# Patient Record
Sex: Female | Born: 1940 | Race: Black or African American | Hispanic: No | Smoking: Never smoker
Health system: Southern US, Community
[De-identification: ages and names within clinical notes are randomized; demographics above are authoritative.]

## PROBLEM LIST (undated history)

## (undated) DIAGNOSIS — E041 Nontoxic single thyroid nodule: Secondary | ICD-10-CM

## (undated) DIAGNOSIS — G609 Hereditary and idiopathic neuropathy, unspecified: Secondary | ICD-10-CM

## (undated) DIAGNOSIS — M1990 Unspecified osteoarthritis, unspecified site: Secondary | ICD-10-CM

## (undated) DIAGNOSIS — G5600 Carpal tunnel syndrome, unspecified upper limb: Secondary | ICD-10-CM

## (undated) DIAGNOSIS — E042 Nontoxic multinodular goiter: Secondary | ICD-10-CM

## (undated) DIAGNOSIS — F5104 Psychophysiologic insomnia: Secondary | ICD-10-CM

## (undated) DIAGNOSIS — E538 Deficiency of other specified B group vitamins: Secondary | ICD-10-CM

## (undated) DIAGNOSIS — M25559 Pain in unspecified hip: Secondary | ICD-10-CM

## (undated) DIAGNOSIS — E785 Hyperlipidemia, unspecified: Secondary | ICD-10-CM

## (undated) DIAGNOSIS — E669 Obesity, unspecified: Secondary | ICD-10-CM

## (undated) DIAGNOSIS — J309 Allergic rhinitis, unspecified: Secondary | ICD-10-CM

## (undated) DIAGNOSIS — I10 Essential (primary) hypertension: Secondary | ICD-10-CM

## (undated) DIAGNOSIS — Z972 Presence of dental prosthetic device (complete) (partial): Secondary | ICD-10-CM

## (undated) DIAGNOSIS — G8929 Other chronic pain: Secondary | ICD-10-CM

## (undated) DIAGNOSIS — M1711 Unilateral primary osteoarthritis, right knee: Secondary | ICD-10-CM

## (undated) HISTORY — PX: EYE SURGERY: SHX253

## (undated) HISTORY — DX: Hyperlipidemia, unspecified: E78.5

## (undated) HISTORY — DX: Obesity, unspecified: E66.9

## (undated) HISTORY — DX: Unspecified osteoarthritis, unspecified site: M19.90

## (undated) HISTORY — PX: BARIATRIC SURGERY: SHX1103

## (undated) HISTORY — DX: Essential (primary) hypertension: I10

## (undated) HISTORY — PX: COLONOSCOPY: SHX174

## (undated) HISTORY — PX: CHOLECYSTECTOMY: SHX55

---

## 2003-10-02 HISTORY — PX: JOINT REPLACEMENT: SHX530

## 2005-01-26 ENCOUNTER — Ambulatory Visit: Payer: Self-pay | Admitting: Family Medicine

## 2005-02-19 ENCOUNTER — Ambulatory Visit: Payer: Self-pay | Admitting: Family Medicine

## 2005-05-07 ENCOUNTER — Ambulatory Visit: Payer: Self-pay | Admitting: Family Medicine

## 2005-06-01 ENCOUNTER — Ambulatory Visit: Payer: Self-pay | Admitting: Family Medicine

## 2005-06-18 ENCOUNTER — Ambulatory Visit: Payer: Self-pay | Admitting: Family Medicine

## 2005-11-09 ENCOUNTER — Ambulatory Visit: Payer: Self-pay | Admitting: Family Medicine

## 2006-03-15 ENCOUNTER — Ambulatory Visit: Payer: Self-pay | Admitting: Family Medicine

## 2006-04-15 ENCOUNTER — Ambulatory Visit: Payer: Self-pay | Admitting: Family Medicine

## 2006-04-22 ENCOUNTER — Encounter: Payer: Self-pay | Admitting: Family Medicine

## 2006-04-22 ENCOUNTER — Other Ambulatory Visit: Admission: RE | Admit: 2006-04-22 | Discharge: 2006-04-22 | Payer: Self-pay | Admitting: Family Medicine

## 2006-04-22 ENCOUNTER — Ambulatory Visit: Payer: Self-pay | Admitting: Family Medicine

## 2006-06-11 ENCOUNTER — Ambulatory Visit: Payer: Self-pay | Admitting: Gastroenterology

## 2006-06-13 ENCOUNTER — Encounter: Admission: RE | Admit: 2006-06-13 | Discharge: 2006-06-13 | Payer: Self-pay | Admitting: Family Medicine

## 2006-06-13 ENCOUNTER — Ambulatory Visit: Payer: Self-pay | Admitting: Family Medicine

## 2006-06-17 ENCOUNTER — Encounter: Payer: Self-pay | Admitting: Family Medicine

## 2006-06-17 ENCOUNTER — Ambulatory Visit: Payer: Self-pay | Admitting: Gastroenterology

## 2006-10-01 HISTORY — PX: COLONOSCOPY: SHX174

## 2006-10-01 LAB — HM COLONOSCOPY

## 2007-01-01 ENCOUNTER — Ambulatory Visit: Payer: Self-pay | Admitting: Family Medicine

## 2007-01-01 LAB — CONVERTED CEMR LAB
Albumin: 3.8 g/dL (ref 3.5–5.2)
Alkaline Phosphatase: 75 units/L (ref 39–117)
BUN: 18 mg/dL (ref 6–23)
Creatinine, Ser: 1.1 mg/dL (ref 0.4–1.2)
GFR calc Af Amer: 65 mL/min
HDL: 46.7 mg/dL (ref >39.0)
Hgb A1c MFr Bld: 9.2 % — ABNORMAL HIGH (ref 4.6–6.0)
LDL Cholesterol: 68 mg/dL (ref 0–99)
Potassium: 4.9 meq/L (ref 3.5–5.1)
Sodium: 144 meq/L (ref 135–145)
Total Bilirubin: 0.6 mg/dL (ref 0.3–1.2)
Total CHOL/HDL Ratio: 3
Triglycerides: 136 mg/dL (ref 0–149)
VLDL: 27 mg/dL (ref 0–40)

## 2007-05-15 DIAGNOSIS — E785 Hyperlipidemia, unspecified: Secondary | ICD-10-CM

## 2007-05-15 DIAGNOSIS — I10 Essential (primary) hypertension: Secondary | ICD-10-CM | POA: Insufficient documentation

## 2007-05-21 ENCOUNTER — Ambulatory Visit: Payer: Self-pay | Admitting: Family Medicine

## 2007-05-21 ENCOUNTER — Encounter: Payer: Self-pay | Admitting: Family Medicine

## 2007-07-28 ENCOUNTER — Ambulatory Visit: Payer: Self-pay | Admitting: Family Medicine

## 2007-07-28 DIAGNOSIS — E119 Type 2 diabetes mellitus without complications: Secondary | ICD-10-CM

## 2007-08-01 ENCOUNTER — Ambulatory Visit: Payer: Self-pay | Admitting: Family Medicine

## 2007-08-06 LAB — CONVERTED CEMR LAB
Albumin: 3.8 g/dL (ref 3.5–5.2)
Alkaline Phosphatase: 85 units/L (ref 39–117)
BUN: 23 mg/dL (ref 6–23)
Basophils Absolute: 0 10*3/uL (ref 0.0–0.1)
Cholesterol: 123 mg/dL (ref 0–200)
Eosinophils Absolute: 0.1 10*3/uL (ref 0.0–0.6)
GFR calc Af Amer: 64 mL/min
GFR calc non Af Amer: 53 mL/min
HCT: 36.5 % (ref 36.0–46.0)
HDL: 39.8 mg/dL (ref >39.0)
Hemoglobin: 12.3 g/dL (ref 12.0–15.0)
Hgb A1c MFr Bld: 10 % — ABNORMAL HIGH (ref 4.6–6.0)
LDL Cholesterol: 67 mg/dL (ref 0–99)
MCHC: 33.9 g/dL (ref 30.0–36.0)
MCV: 85.8 fL (ref 78.0–100.0)
Monocytes Absolute: 0.4 10*3/uL (ref 0.2–0.7)
Monocytes Relative: 4.9 % (ref 3.0–11.0)
Neutrophils Relative %: 53.6 % (ref 43.0–77.0)
Potassium: 4.4 meq/L (ref 3.5–5.1)
Sodium: 144 meq/L (ref 135–145)
TSH: 2.45 microintl units/mL (ref 0.35–5.50)
Total CHOL/HDL Ratio: 3.1

## 2007-10-07 ENCOUNTER — Telehealth: Payer: Self-pay | Admitting: Family Medicine

## 2007-10-20 ENCOUNTER — Ambulatory Visit: Payer: Self-pay | Admitting: Family Medicine

## 2007-10-20 DIAGNOSIS — M1990 Unspecified osteoarthritis, unspecified site: Secondary | ICD-10-CM | POA: Insufficient documentation

## 2007-10-21 ENCOUNTER — Telehealth: Payer: Self-pay | Admitting: Family Medicine

## 2007-10-23 ENCOUNTER — Telehealth: Payer: Self-pay | Admitting: Family Medicine

## 2007-10-24 ENCOUNTER — Telehealth: Payer: Self-pay | Admitting: Family Medicine

## 2007-11-05 ENCOUNTER — Telehealth: Payer: Self-pay | Admitting: Family Medicine

## 2007-12-03 ENCOUNTER — Ambulatory Visit: Payer: Self-pay | Admitting: Family Medicine

## 2007-12-03 LAB — CONVERTED CEMR LAB: Blood Glucose, Fingerstick: 124

## 2008-01-12 ENCOUNTER — Telehealth: Payer: Self-pay | Admitting: Internal Medicine

## 2008-01-14 ENCOUNTER — Ambulatory Visit: Payer: Self-pay | Admitting: Internal Medicine

## 2008-01-14 DIAGNOSIS — B3731 Acute candidiasis of vulva and vagina: Secondary | ICD-10-CM | POA: Insufficient documentation

## 2008-01-14 DIAGNOSIS — B373 Candidiasis of vulva and vagina: Secondary | ICD-10-CM

## 2008-01-14 LAB — CONVERTED CEMR LAB
Blood in Urine, dipstick: NEGATIVE
Glucose, Urine, Semiquant: NEGATIVE
Ketones, urine, test strip: NEGATIVE
Nitrite: NEGATIVE
Protein, U semiquant: NEGATIVE
Specific Gravity, Urine: 1.02
pH: 5.5

## 2008-05-05 ENCOUNTER — Telehealth: Payer: Self-pay | Admitting: Family Medicine

## 2008-05-12 ENCOUNTER — Encounter: Payer: Self-pay | Admitting: Family Medicine

## 2008-05-12 ENCOUNTER — Ambulatory Visit: Payer: Self-pay | Admitting: Family Medicine

## 2008-05-12 ENCOUNTER — Other Ambulatory Visit: Admission: RE | Admit: 2008-05-12 | Discharge: 2008-05-12 | Payer: Self-pay | Admitting: Family Medicine

## 2008-05-13 ENCOUNTER — Encounter: Payer: Self-pay | Admitting: Family Medicine

## 2008-05-26 ENCOUNTER — Encounter: Payer: Self-pay | Admitting: Family Medicine

## 2008-05-26 ENCOUNTER — Encounter: Admission: RE | Admit: 2008-05-26 | Discharge: 2008-05-26 | Payer: Self-pay | Admitting: Family Medicine

## 2008-05-28 ENCOUNTER — Ambulatory Visit: Payer: Self-pay | Admitting: Family Medicine

## 2008-05-31 ENCOUNTER — Encounter: Payer: Self-pay | Admitting: Family Medicine

## 2008-06-09 ENCOUNTER — Telehealth: Payer: Self-pay | Admitting: Family Medicine

## 2008-06-29 ENCOUNTER — Ambulatory Visit: Payer: Self-pay | Admitting: Family Medicine

## 2008-06-30 ENCOUNTER — Encounter: Payer: Self-pay | Admitting: Family Medicine

## 2008-06-30 ENCOUNTER — Ambulatory Visit: Payer: Self-pay | Admitting: Family Medicine

## 2008-08-18 ENCOUNTER — Encounter: Payer: Self-pay | Admitting: Family Medicine

## 2008-08-25 ENCOUNTER — Telehealth: Payer: Self-pay | Admitting: *Deleted

## 2008-08-30 ENCOUNTER — Telehealth: Payer: Self-pay | Admitting: Family Medicine

## 2008-09-09 ENCOUNTER — Ambulatory Visit: Payer: Self-pay | Admitting: Family Medicine

## 2008-09-10 LAB — CONVERTED CEMR LAB
Albumin: 3.7 g/dL (ref 3.5–5.2)
Alkaline Phosphatase: 79 units/L (ref 39–117)
Basophils Absolute: 0 10*3/uL (ref 0.0–0.1)
Bilirubin, Direct: 0.1 mg/dL (ref 0.0–0.3)
Eosinophils Absolute: 0.3 10*3/uL (ref 0.0–0.7)
GFR calc Af Amer: 72 mL/min
GFR calc non Af Amer: 59 mL/min
HCT: 34.4 % — ABNORMAL LOW (ref 36.0–46.0)
HDL: 42.6 mg/dL (ref >39.0)
MCV: 87.7 fL (ref 78.0–100.0)
Monocytes Absolute: 0.4 10*3/uL (ref 0.1–1.0)
Platelets: 132 10*3/uL — ABNORMAL LOW (ref 150–400)
Potassium: 4.2 meq/L (ref 3.5–5.1)
RDW: 13.8 % (ref 11.5–14.6)
Sodium: 144 meq/L (ref 135–145)
Triglycerides: 53 mg/dL (ref 0–149)

## 2008-10-12 ENCOUNTER — Telehealth: Payer: Self-pay | Admitting: Family Medicine

## 2008-11-22 ENCOUNTER — Ambulatory Visit: Payer: Self-pay | Admitting: Family Medicine

## 2008-11-26 ENCOUNTER — Encounter: Payer: Self-pay | Admitting: Family Medicine

## 2008-12-20 ENCOUNTER — Ambulatory Visit: Payer: Self-pay | Admitting: Family Medicine

## 2009-01-24 ENCOUNTER — Ambulatory Visit: Payer: Self-pay | Admitting: Family Medicine

## 2009-02-22 ENCOUNTER — Ambulatory Visit: Payer: Self-pay | Admitting: Family Medicine

## 2009-03-28 ENCOUNTER — Ambulatory Visit: Payer: Self-pay | Admitting: Family Medicine

## 2009-04-25 ENCOUNTER — Ambulatory Visit: Payer: Self-pay | Admitting: Family Medicine

## 2009-05-17 ENCOUNTER — Telehealth: Payer: Self-pay | Admitting: Family Medicine

## 2009-06-02 ENCOUNTER — Ambulatory Visit: Payer: Self-pay | Admitting: Family Medicine

## 2009-06-22 ENCOUNTER — Telehealth: Payer: Self-pay | Admitting: Family Medicine

## 2009-07-28 ENCOUNTER — Encounter (INDEPENDENT_AMBULATORY_CARE_PROVIDER_SITE_OTHER): Payer: Self-pay | Admitting: *Deleted

## 2009-08-19 ENCOUNTER — Ambulatory Visit: Payer: Self-pay | Admitting: Family Medicine

## 2009-08-19 LAB — CONVERTED CEMR LAB: Blood Glucose, AC Bkfst: 88 mg/dL

## 2009-08-29 ENCOUNTER — Ambulatory Visit: Payer: Self-pay | Admitting: Family Medicine

## 2009-08-29 ENCOUNTER — Encounter (INDEPENDENT_AMBULATORY_CARE_PROVIDER_SITE_OTHER): Payer: Self-pay | Admitting: *Deleted

## 2009-08-29 ENCOUNTER — Encounter: Payer: Self-pay | Admitting: Family Medicine

## 2009-09-02 ENCOUNTER — Telehealth: Payer: Self-pay | Admitting: Family Medicine

## 2009-11-30 ENCOUNTER — Ambulatory Visit: Payer: Self-pay | Admitting: Family Medicine

## 2009-11-30 DIAGNOSIS — G609 Hereditary and idiopathic neuropathy, unspecified: Secondary | ICD-10-CM | POA: Insufficient documentation

## 2009-12-01 LAB — CONVERTED CEMR LAB
Alkaline Phosphatase: 86 units/L (ref 39–117)
BUN: 6 mg/dL (ref 6–23)
Basophils Relative: 0.5 % (ref 0.0–3.0)
Bilirubin, Direct: 0.1 mg/dL (ref 0.0–0.3)
Calcium: 9.5 mg/dL (ref 8.4–10.5)
Creatinine, Ser: 0.8 mg/dL (ref 0.4–1.2)
Creatinine,U: 246.4 mg/dL
Eosinophils Absolute: 0.2 10*3/uL (ref 0.0–0.7)
Eosinophils Relative: 2.6 % (ref 0.0–5.0)
Hgb A1c MFr Bld: 5.9 % (ref 4.6–6.5)
Lymphocytes Relative: 39.2 % (ref 12.0–46.0)
Neutrophils Relative %: 51.6 % (ref 43.0–77.0)
Platelets: 162 10*3/uL (ref 150.0–400.0)
RBC: 3.88 M/uL (ref 3.87–5.11)
Total Bilirubin: 0.3 mg/dL (ref 0.3–1.2)
Total Protein: 6.3 g/dL (ref 6.0–8.3)
Vitamin B-12: 496 pg/mL (ref 211–911)
WBC: 6.2 10*3/uL (ref 4.5–10.5)

## 2010-02-02 ENCOUNTER — Ambulatory Visit: Payer: Self-pay | Admitting: Family Medicine

## 2010-02-02 LAB — CONVERTED CEMR LAB: Blood Glucose, Fingerstick: 106

## 2010-03-29 ENCOUNTER — Encounter: Payer: Self-pay | Admitting: Family Medicine

## 2010-05-23 ENCOUNTER — Ambulatory Visit: Payer: Self-pay | Admitting: Family Medicine

## 2010-07-04 ENCOUNTER — Telehealth: Payer: Self-pay | Admitting: Family Medicine

## 2010-07-17 ENCOUNTER — Ambulatory Visit: Payer: Self-pay | Admitting: Family Medicine

## 2010-07-17 DIAGNOSIS — L259 Unspecified contact dermatitis, unspecified cause: Secondary | ICD-10-CM

## 2010-07-17 DIAGNOSIS — R300 Dysuria: Secondary | ICD-10-CM | POA: Insufficient documentation

## 2010-07-17 LAB — CONVERTED CEMR LAB
Bilirubin Urine: NEGATIVE
Nitrite: NEGATIVE
Protein, U semiquant: NEGATIVE
Urobilinogen, UA: 0.2
WBC Urine, dipstick: NEGATIVE

## 2010-08-30 ENCOUNTER — Ambulatory Visit: Payer: Self-pay | Admitting: Family Medicine

## 2010-08-30 ENCOUNTER — Encounter: Payer: Self-pay | Admitting: Family Medicine

## 2010-10-22 ENCOUNTER — Encounter: Payer: Self-pay | Admitting: Surgery

## 2010-10-28 ENCOUNTER — Encounter: Payer: Self-pay | Admitting: Family Medicine

## 2010-10-29 LAB — CONVERTED CEMR LAB
ALT: 13 units/L (ref 0–35)
ALT: 15 units/L (ref 0–35)
AST: 16 units/L (ref 0–37)
AST: 17 units/L (ref 0–37)
Albumin: 3.7 g/dL (ref 3.5–5.2)
Alkaline Phosphatase: 85 units/L (ref 39–117)
Alkaline Phosphatase: 89 units/L (ref 39–117)
BUN: 13 mg/dL (ref 6–23)
BUN: 16 mg/dL (ref 6–23)
Basophils Absolute: 0 10*3/uL (ref 0.0–0.1)
Basophils Relative: 0.3 % (ref 0.0–3.0)
Basophils Relative: 0.4 % (ref 0.0–1.0)
Bilirubin, Direct: 0.1 mg/dL (ref 0.0–0.3)
CO2: 29 meq/L (ref 19–32)
CO2: 30 meq/L (ref 19–32)
Calcium: 9.8 mg/dL (ref 8.4–10.5)
Calcium: 9.8 mg/dL (ref 8.4–10.5)
Chloride: 100 meq/L (ref 96–112)
Chloride: 111 meq/L (ref 96–112)
Cholesterol: 132 mg/dL (ref 0–200)
Creatinine, Ser: 1 mg/dL (ref 0.4–1.2)
Creatinine,U: 193.1 mg/dL
Eosinophils Absolute: 0.2 10*3/uL (ref 0.0–0.7)
Eosinophils Relative: 2.2 % (ref 0.0–5.0)
Eosinophils Relative: 2.4 % (ref 0.0–5.0)
GFR calc Af Amer: 72 mL/min
GFR calc non Af Amer: 71.33 mL/min (ref >60)
Glucose, Bld: 87 mg/dL (ref 70–99)
HCT: 35.8 % — ABNORMAL LOW (ref 36.0–46.0)
HDL: 51 mg/dL (ref >39.00)
HDL: 55.2 mg/dL (ref >39.0)
Hemoglobin: 12.1 g/dL (ref 12.0–15.0)
Hgb A1c MFr Bld: 8.5 % — ABNORMAL HIGH (ref 4.6–6.5)
Ketones, urine, test strip: NEGATIVE
LDL Cholesterol: 69 mg/dL (ref 0–99)
LDL Cholesterol: 99 mg/dL (ref 0–99)
Lymphocytes Relative: 36 % (ref 12.0–46.0)
Lymphocytes Relative: 38.3 % (ref 12.0–46.0)
Lymphs Abs: 2.4 10*3/uL (ref 0.7–4.0)
MCHC: 33.8 g/dL (ref 30.0–36.0)
MCV: 85.9 fL (ref 78.0–100.0)
Microalb Creat Ratio: 4.1 mg/g (ref 0.0–30.0)
Microalb, Ur: 0.8 mg/dL (ref 0.0–1.9)
Monocytes Absolute: 0.4 10*3/uL (ref 0.1–1.0)
Monocytes Relative: 5.8 % (ref 3.0–11.0)
Monocytes Relative: 6.4 % (ref 3.0–12.0)
Neutro Abs: 3.7 10*3/uL (ref 1.4–7.7)
Neutro Abs: 3.8 10*3/uL (ref 1.4–7.7)
Neutrophils Relative %: 54.9 % (ref 43.0–77.0)
Nitrite: NEGATIVE
Platelets: 126 10*3/uL — ABNORMAL LOW (ref 150.0–400.0)
Platelets: 154 10*3/uL (ref 150–400)
Potassium: 3.8 meq/L (ref 3.5–5.1)
RBC: 4.05 M/uL (ref 3.87–5.11)
RBC: 4.17 M/uL (ref 3.87–5.11)
RDW: 13.7 % (ref 11.5–14.6)
RDW: 13.8 % (ref 11.5–14.6)
Sodium: 144 meq/L (ref 135–145)
Specific Gravity, Urine: >=1.03
TSH: 1.74 microintl units/mL (ref 0.35–5.50)
Total Bilirubin: 0.5 mg/dL (ref 0.3–1.2)
Total CHOL/HDL Ratio: 3
Total CHOL/HDL Ratio: 3.1
Total Protein: 6.6 g/dL (ref 6.0–8.3)
Total Protein: 7.2 g/dL (ref 6.0–8.3)
Triglycerides: 61 mg/dL (ref 0.0–149.0)
Triglycerides: 83 mg/dL (ref 0–149)
Urobilinogen, UA: 0.2
VLDL: 12.2 mg/dL (ref 0.0–40.0)
VLDL: 17 mg/dL (ref 0–40)
WBC Urine, dipstick: NEGATIVE
WBC: 6.8 10*3/uL (ref 4.5–10.5)
WBC: 6.8 10*3/uL (ref 4.5–10.5)

## 2010-10-31 NOTE — Assessment & Plan Note (Signed)
Summary: emp/pt fasting/cjr   Vital Signs:  Patient profile:   70 year old female Weight:      193 pounds BMI:     37.83 BP sitting:   114 / 70  (left arm) Cuff size:   regular  Vitals Entered By: Raechel Ache, RN (May 23, 2010 8:57 AM) CC: OV, fasting. 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 BS's 140-150's.   History of Present Illness: 70 yr old female for a cpx. She is doing well and feels fine. She has recovered well from her gastric bypass surgery, and she continues to slowly lose weight. She had comnplete labs drawn on 03-29-10 through her surgeon's office, including an A1c which was 7.7.  Her meds have been decreased a bit to keep from dropping her BP too low.   Allergies (verified): No Known Drug Allergies  Past History:  Past Medical History: Reviewed history from 11/22/2008 and no changes required. Hyperlipidemia Hypertension Osteoarthritis Diabetes mellitus, type II obesity  Past Surgical History: Reviewed history from 11/30/2009 and no changes required. Cholecystectomy Total knee replacement, left 2005 colonoscopy 2008, per Dr. Jarold Motto, diverticulosis  only, repeat 5 yrs bariatric surgery 10-27-09 per Dr. Annabelle Harman Portenier at [facility] History: Reviewed history from 05/15/2007 and no changes required. Family History of Arthritis Family History Diabetes 1st degree relative Family History Hypertension Family History of Cardiovascular disorder  Social History: Reviewed history from 05/15/2007 and no changes required. Never Smoked Alcohol use-no Drug use-no Regular exercise-yes  Review of Systems  The patient denies anorexia, fever, weight gain, vision loss, decreased hearing, hoarseness, chest pain, syncope, dyspnea on exertion, peripheral edema, prolonged cough, headaches, hemoptysis, abdominal pain, melena, hematochezia, severe indigestion/heartburn, hematuria, incontinence, genital sores, muscle weakness, suspicious skin lesions, transient blindness,  difficulty walking, depression, unusual weight change, abnormal bleeding, enlarged lymph nodes, angioedema, breast masses, and testicular masses.    Physical Exam  General:  overweight-appearing.   Head:  Normocephalic and atraumatic without obvious abnormalities. No apparent alopecia or balding. Eyes:  No corneal or conjunctival inflammation noted. EOMI. Perrla. Funduscopic exam benign, without hemorrhages, exudates or papilledema. Vision grossly normal. Ears:  External ear exam shows no significant lesions or deformities.  Otoscopic examination reveals clear canals, tympanic membranes are intact bilaterally without bulging, retraction, inflammation or discharge. Hearing is grossly normal bilaterally. Nose:  External nasal examination shows no deformity or inflammation. Nasal mucosa are pink and moist without lesions or exudates. Mouth:  Oral mucosa and oropharynx without lesions or exudates.  Teeth in good repair. Neck:  No deformities, masses, or tenderness noted. Chest Wall:  No deformities, masses, or tenderness noted. Breasts:  No mass, nodules, thickening, tenderness, bulging, retraction, inflamation, nipple discharge or skin changes noted.   Lungs:  Normal respiratory effort, chest expands symmetrically. Lungs are clear to auscultation, no crackles or wheezes. Heart:  Normal rate and regular rhythm. S1 and S2 normal without gallop, murmur, click, rub or other extra sounds. EKG normal Abdomen:  Bowel sounds positive,abdomen soft and non-tender without masses, organomegaly or hernias noted. Msk:  No deformity or scoliosis noted of thoracic or lumbar spine.   Pulses:  R and L carotid,radial,femoral,dorsalis pedis and posterior tibial pulses are full and equal bilaterally Extremities:  No clubbing, cyanosis, edema, or deformity noted with normal full range of motion of all joints.   Neurologic:  No cranial nerve deficits noted. Station and gait are normal. Plantar reflexes are down-going  bilaterally. DTRs are symmetrical throughout. Sensory, motor and coordinative functions appear intact. Skin:  Intact without suspicious lesions or rashes Cervical Nodes:  No lymphadenopathy noted Axillary Nodes:  No palpable lymphadenopathy Inguinal Nodes:  No significant adenopathy Psych:  Cognition and judgment appear intact. Alert and cooperative with normal attention span and concentration. No apparent delusions, illusions, hallucinations   Impression & Recommendations:  Problem # 1:  WELL ADULT EXAM (ICD-V70.0)  Orders: EKG w/ Interpretation (93000)  Complete Medication List: 1)  Lantus 100 Unit/ml Soln (Insulin glargine) .Marland Kitchen.. 15  units at bedtime 2)  Zocor 40 Mg Tabs (Simvastatin) .... Take 1 tablet by mouth once a day 3)  Tylenol Extra Strength 500 Mg Tabs (Acetaminophen) .... 2 tabs two times a day 4)  Furosemide 40 Mg Tabs (Furosemide) .... 1/2 tab once daily 5)  Metformin Hcl 500 Mg Tabs (Metformin hcl) .... 2 tabs two times a day 6)  Aspirin 81 Mg Tbec (Aspirin) .... Once daily 7)  Enalapril Maleate 5 Mg Tabs (Enalapril maleate) .... Once daily 8)  Omeprazole 20 Mg Cpdr (Omeprazole) .... Once daily  Patient Instructions: 1)  Will make some medication adjustments as above.  2)  Please schedule a follow-up appointment in 6 months .  3)  It is important that you exercise reguarly at least 20 minutes 5 times a week. If you develop chest pain, have severe difficulty breathing, or feel very tired, stop exercising immediately and seek medical attention.  Prescriptions: METFORMIN HCL 500 MG TABS (METFORMIN HCL) 2 tabs two times a day  #120 x 11   Entered and Authorized by:   Nelwyn Salisbury MD   Signed by:   Nelwyn Salisbury MD on 05/23/2010   Method used:   Electronically to        [facility]. # [phone]* (retail)       [address]       [place], [place]  [address]       Ph: [phone]       Fax: [phone]   RxID:   [id number] METFORMIN HCL 500 MG  TABS (METFORMIN HCL) two times a day  #30 x 11   Entered and Authorized by:   Nelwyn Salisbury MD   Signed by:   Nelwyn Salisbury MD on 05/23/2010   Method used:   Electronically to        [facility]. # [phone]* (retail)       [address]       [place], [place]  [address]       Ph: [phone]       Fax: [phone]   RxID:   [id number] ZOCOR 40 MG  TABS (SIMVASTATIN) Take 1 tablet by mouth once a day  #30 x 11   Entered and Authorized by:   Nelwyn Salisbury MD   Signed by:   Nelwyn Salisbury MD on 05/23/2010   Method used:   Electronically to        [facility]. # [id number]* (retail)       [address]       [place], [place]  [address]       Ph: [phone]       Fax: [phone]   RxID:   [id number] ENALAPRIL MALEATE 5 MG TABS (ENALAPRIL MALEATE) once daily  #30 x 11   Entered and Authorized by:   Jeannett Senior  Marguerita Beards MD   Signed by:   Nelwyn Salisbury MD on 05/23/2010   Method used:   Electronically to        [facility]. # [phone]* (retail)       [address]       [place], [place]  [address]       Ph: [phone]       Fax: [phone]   RxID:   [id number]   Laboratory Results   Urine Tests    Routine Urinalysis   Color: yellow Appearance: Clear Glucose: negative   (Normal Range: Negative) Bilirubin: negative   (Normal Range: Negative) Ketone: negative   (Normal Range: Negative) Spec. Gravity: >=1.030   (Normal Range: 1.003-1.035) Blood: negative   (Normal Range: Negative) pH: 6.0   (Normal Range: 5.0-8.0) Protein: trace   (Normal Range: Negative) Urobilinogen: 0.2   (Normal Range: 0-1) Nitrite: negative   (Normal Range: Negative) Leukocyte Esterace: negative   (Normal Range: Negative)

## 2010-10-31 NOTE — Progress Notes (Signed)
Summary: REQUEST FOR ABX  Phone Note Call from Patient   Caller: Patient    [phone] Summary of Call: Pt adv that she wants to know if she can have a Rx for abx sent to  [facility] - [place], [place].... Pt c/o  itching and painful urination, burning to area.... Pt was offered OV but declined stating she was just in office last month.  Initial call taken by: Debbra Riding,  July 04, 2010 4:27 PM  Follow-up for Phone Call        yes but she wasn't sick last month. She needs an OV  Follow-up by: Nelwyn Salisbury MD,  July 05, 2010 9:51 AM  Additional Follow-up for Phone Call Additional follow up Details #1::        Left msg for pt to c/b if she would like to schedule appt.  Additional Follow-up by: Debbra Riding,  July 05, 2010 9:56 AM

## 2010-10-31 NOTE — Assessment & Plan Note (Signed)
Summary: consult re: itching around ankles and back for approx 3 weeks...   Vital Signs:  Patient profile:   70 year old female Weight:      191 pounds O2 Sat:      98 % Temp:     98.6 degrees F Pulse rate:   71 / minute BP sitting:   124 / 82  (left arm)  Vitals Entered By: Pura Spice, RN (July 17, 2010 2:47 PM) CC: rash ankles and back area c/o itching  Comments Flu Vaccine Consent Questions     Do you have a history of severe allergic reactions to this vaccine? no    Any prior history of allergic reactions to egg and/or gelatin? no    Do you have a sensitivity to the preservative Thimersol? no    Do you have a past history of Guillan-Barre Syndrome? no    Do you currently have an acute febrile illness? no    Have you ever had a severe reaction to latex? no    Vaccine information given and explained to patient? yes    Are you currently pregnant? no    Lot Number:[id number]   Exp Date:03/31/2011   Site Given  Left Deltoid IM Pura Spice, RN  July 17, 2010 2:48 PM    History of Present Illness: Here for a 3 weeks hx of an intensely itchy rash over the back and on both lower legs. Using OTC moisterizers. Also she mentions that she had a few days of burning on urination and cloudy urine about 2 weeks ago. No fever or nausea. Then this all went away.   Allergies (verified): No Known Drug Allergies  Past History:  Past Medical History: Reviewed history from 11/22/2008 and no changes required. Hyperlipidemia Hypertension Osteoarthritis Diabetes mellitus, type II obesity  Past Surgical History: Reviewed history from 11/30/2009 and no changes required. Cholecystectomy Total knee replacement, left 2005 colonoscopy 2008, per Dr. Jarold Motto, diverticulosis  only, repeat 5 yrs bariatric surgery 10-27-09 per Dr. Annabelle Harman Portenier at [facility]  Review of Systems  The patient denies anorexia, fever, weight loss, weight gain, vision loss, decreased hearing, hoarseness, chest  pain, syncope, dyspnea on exertion, peripheral edema, prolonged cough, headaches, hemoptysis, abdominal pain, melena, hematochezia, severe indigestion/heartburn, hematuria, incontinence, genital sores, muscle weakness, suspicious skin lesions, transient blindness, difficulty walking, depression, unusual weight change, abnormal bleeding, enlarged lymph nodes, angioedema, breast masses, and testicular masses.    Physical Exam  General:  Well-developed,well-nourished,in no acute distress; alert,appropriate and cooperative throughout examination Lungs:  Normal respiratory effort, chest expands symmetrically. Lungs are clear to auscultation, no crackles or wheezes. Heart:  Normal rate and regular rhythm. S1 and S2 normal without gallop, murmur, click, rub or other extra sounds. Abdomen:  Bowel sounds positive,abdomen soft and non-tender without masses, organomegaly or hernias noted. Skin:  patches of red macular scaly skin as above    Impression & Recommendations:  Problem # 1:  ECZEMA (ICD-692.9)  Her updated medication list for this problem includes:    Triamcinolone Acetonide 0.5 % Crea (Triamcinolone acetonide) .Marland Kitchen... Apply two times a day as needed  Problem # 2:  DYSURIA (ICD-788.1)  Orders: UA Dipstick w/o Micro (manual) (16109)  Complete Medication List: 1)  Lantus 100 Unit/ml Soln (Insulin glargine) .Marland Kitchen.. 15  units at bedtime 2)  Zocor 40 Mg Tabs (Simvastatin) .... Take 1 tablet by mouth once a day 3)  Tylenol Extra Strength 500 Mg Tabs (Acetaminophen) .... 2 tabs two times a day 4)  Furosemide 40 Mg Tabs (Furosemide) .... 1/2 tab once daily 5)  Metformin Hcl 500 Mg Tabs (Metformin hcl) .... 2 tabs two times a day 6)  Aspirin 81 Mg Tbec (Aspirin) .... Once daily 7)  Enalapril Maleate 5 Mg Tabs (Enalapril maleate) .... Once daily 8)  Omeprazole 20 Mg Cpdr (Omeprazole) .... Once daily 9)  Triamcinolone Acetonide 0.5 % Crea (Triamcinolone acetonide) .... Apply two times a day as  needed  Other Orders: Flu Vaccine 69yrs + MEDICARE PATIENTS (U7253) Administration Flu vaccine - MCR (G6440)  Patient Instructions: 1)  Please schedule a follow-up appointment as needed .  Prescriptions: TRIAMCINOLONE ACETONIDE 0.5 % CREA (TRIAMCINOLONE ACETONIDE) apply two times a day as needed  #60 x 5   Entered and Authorized by:   Nelwyn Salisbury MD   Signed by:   Nelwyn Salisbury MD on 07/17/2010   Method used:   Electronically to        [facility]. # [phone]* (retail)       [address]       [place], [place]  [address]       Ph: [phone]       Fax: [phone]   RxID:   [id number]    Orders Added: 1)  Flu Vaccine 8yrs + MEDICARE PATIENTS [Q2039] 2)  Administration Flu vaccine - MCR [G0008] 3)  Est. Patient Level IV [02542] 4)  UA Dipstick w/o Micro (manual) [81002]    Laboratory Results   Urine Tests  Date/Time Received: July 17, 2010 3:02 PM  Date/Time Reported: 3:02 PM   Routine Urinalysis   Color: yellow Appearance: Clear Glucose: negative   (Normal Range: Negative) Bilirubin: negative   (Normal Range: Negative) Ketone: negative   (Normal Range: Negative) Spec. Gravity: 1.015   (Normal Range: 1.003-1.035) Blood: negative   (Normal Range: Negative) pH: 5.0   (Normal Range: 5.0-8.0) Protein: negative   (Normal Range: Negative) Urobilinogen: 0.2   (Normal Range: 0-1) Nitrite: negative   (Normal Range: Negative) Leukocyte Esterace: negative   (Normal Range: Negative)    Comments: Pura Spice, RN  July 17, 2010 3:03 PM

## 2010-10-31 NOTE — Assessment & Plan Note (Signed)
Summary: fu on dizziness/njr   Vital Signs:  Patient profile:   70 year old female Weight:      208 pounds BMI:     40.77 Temp:     98.3 degrees F oral BP sitting:   124 / 78  (left arm) Cuff size:   regular  Vitals Entered By: Raechel Ache, RN (Feb 02, 2010 10:42 AM) CC: Still feels dizzy, headache and feels drunk. Is Patient Diabetic? Yes CBG Result 106   History of Present Illness: Here to follow up on HTN and DM. She has been very vigilant about the food she eats, and she has been able to lose 22 lbs in the past 2 months. Now she feels better with more energy most of the time, but she does have spells when she gets a little weak and a little lightheaded.   Allergies: No Known Drug Allergies  Past History:  Past Medical History: Reviewed history from 11/22/2008 and no changes required. Hyperlipidemia Hypertension Osteoarthritis Diabetes mellitus, type II obesity  Past Surgical History: Reviewed history from 11/30/2009 and no changes required. Cholecystectomy Total knee replacement, left 2005 colonoscopy 2008, per Dr. Jarold Motto, diverticulosis  only, repeat 5 yrs bariatric surgery 10-27-09 per Dr. Annabelle Harman Portenier at [facility]  Review of Systems  The patient denies anorexia, fever, weight gain, vision loss, decreased hearing, hoarseness, chest pain, syncope, dyspnea on exertion, peripheral edema, prolonged cough, headaches, hemoptysis, abdominal pain, melena, hematochezia, severe indigestion/heartburn, hematuria, incontinence, genital sores, muscle weakness, suspicious skin lesions, transient blindness, difficulty walking, depression, unusual weight change, abnormal bleeding, enlarged lymph nodes, angioedema, breast masses, and testicular masses.    Physical Exam  General:  Well-developed,well-nourished,in no acute distress; alert,appropriate and cooperative throughout examination Neck:  No deformities, masses, or tenderness noted. Lungs:  Normal respiratory effort, chest  expands symmetrically. Lungs are clear to auscultation, no crackles or wheezes. Heart:  Normal rate and regular rhythm. S1 and S2 normal without gallop, murmur, click, rub or other extra sounds. Neurologic:  alert & oriented X3, cranial nerves II-XII intact, and gait normal.     Impression & Recommendations:  Problem # 1:  DIABETES MELLITUS, TYPE II (ICD-250.00)  The following medications were removed from the medication list:    Metformin Hcl 500 Mg Tabs (Metformin hcl) .Marland Kitchen... Take two tablets twice daily Her updated medication list for this problem includes:    Enalapril Maleate 20 Mg Tabs (Enalapril maleate) .Marland Kitchen... 1/2 tab once daily    Aspirin 325 Mg Tabs (Aspirin) .Marland Kitchen... Take 1 tablet by mouth once a day    Lantus 100 Unit/ml Soln (Insulin glargine) .Marland KitchenMarland KitchenMarland KitchenMarland Kitchen 15  units at bedtime    Metformin Hcl 500 Mg Tabs (Metformin hcl) .Marland Kitchen... 1/2 tab two times a day  Orders: Capillary Blood Glucose/CBG (62703)  Problem # 2:  HYPERTENSION (ICD-401.9)  Her updated medication list for this problem includes:    Enalapril Maleate 20 Mg Tabs (Enalapril maleate) .Marland Kitchen... 1/2 tab once daily    Furosemide 40 Mg Tabs (Furosemide) ..... Once daily as needed fluid  Complete Medication List: 1)  Enalapril Maleate 20 Mg Tabs (Enalapril maleate) .... 1/2 tab once daily 2)  Aspirin 325 Mg Tabs (Aspirin) .... Take 1 tablet by mouth once a day 3)  Lantus 100 Unit/ml Soln (Insulin glargine) .Marland Kitchen.. 15  units at bedtime 4)  Zocor 40 Mg Tabs (Simvastatin) .... Take 1 tablet by mouth once a day 5)  Tylenol Extra Strength 500 Mg Tabs (Acetaminophen) .... 2 tabs two times a  day 6)  Temazepam 30 Mg Caps (Temazepam) .Marland Kitchen.. 1 at bedtime 7)  Furosemide 40 Mg Tabs (Furosemide) .... Once daily as needed fluid 8)  Metformin Hcl 500 Mg Tabs (Metformin hcl) .... 1/2 tab two times a day  Patient Instructions: 1)  Change meds as we discussed.  2)  It is important that you exercise reguarly at least 20 minutes 5 times a week. If you  develop chest pain, have severe difficulty breathing, or feel very tired, stop exercising immediately and seek medical attention.  3)  Please schedule a follow-up appointment in 3 months .  Prescriptions: TEMAZEPAM 30 MG CAPS (TEMAZEPAM) 1 at bedtime  #30 x 5   Entered and Authorized by:   Nelwyn Salisbury MD   Signed by:   Nelwyn Salisbury MD on 02/02/2010   Method used:   Print then Give to Patient   RxID:   [id number] ENALAPRIL MALEATE 20 MG  TABS (ENALAPRIL MALEATE) 1/2 tab once daily  #30 x 11   Entered and Authorized by:   Nelwyn Salisbury MD   Signed by:   Nelwyn Salisbury MD on 02/02/2010   Method used:   Electronically to        [facility]. # [phone]* (retail)       [address]       [place], [place]  [address]       Ph: [phone]       Fax: [phone]   RxID:   [id number] FUROSEMIDE 40 MG TABS (FUROSEMIDE) once daily as needed fluid  #30 x 11   Entered and Authorized by:   Nelwyn Salisbury MD   Signed by:   Nelwyn Salisbury MD on 02/02/2010   Method used:   Electronically to        [facility]. # [phone]* (retail)       [address]       [place], [place]  [address]       Ph: [phone]       Fax: [phone]   RxID:   [id number] LANTUS 100 UNIT/ML  SOLN (INSULIN GLARGINE) 15  units at bedtime  #450 x 11   Entered and Authorized by:   Nelwyn Salisbury MD   Signed by:   Nelwyn Salisbury MD on 02/02/2010   Method used:   Electronically to        [facility]. # [phone]* (retail)       [address]       [place], [place]  [address]       Ph: [phone]       Fax: [phone]   RxID:   [id number] METFORMIN HCL 500 MG TABS (METFORMIN HCL) 1/2 tab two times a day  #30 x 11   Entered and Authorized by:   Nelwyn Salisbury MD   Signed by:   Nelwyn Salisbury MD on 02/02/2010   Method used:   Electronically to        [facility]. # [phone]* (retail)       [address]       [place], [place]   [address]       Ph: [phone]       Fax: [phone]  RxID:   [id number]

## 2010-10-31 NOTE — Assessment & Plan Note (Signed)
Summary: CONSULT RE: B-12 INJ/CJR   Vital Signs:  Patient profile:   70 year old female Weight:      230 pounds Temp:     99.0 degrees F oral Pulse rate:   112 / minute BP sitting:   122 / 74  (left arm) Cuff size:   large  Vitals Entered By: Alfred Levins, CMA (November 30, 2009 1:11 PM) CC: needs b12 shot and having leg pain   History of Present Illness: here to follow up after bariatric surgery at [facility] on 10-27-09. She did well with the surgery but she has not really lost any weight at all. They had stopped a lot of her meds, including aspirin, Zocor, Enalapril, Etodolac, Furosemide, Januvia, and Temazepam. Her blood glucoses have been stable in the mid to high 100s, and her BP is stable. She feels tired all the time, and she gets tingling and sharp pains down both legs into the feet. She still has her arthritic pains in the spine, shoulders, and hips as well.   Allergies: No Known Drug Allergies  Past History:  Past Medical History: Reviewed history from 11/22/2008 and no changes required. Hyperlipidemia Hypertension Osteoarthritis Diabetes mellitus, type II obesity  Past Surgical History: Cholecystectomy Total knee replacement, left 2005 colonoscopy 2008, per Dr. Jarold Motto, diverticulosis  only, repeat 5 yrs bariatric surgery 10-27-09 per Dr. Annabelle Harman Portenier at [facility]  Review of Systems  The patient denies anorexia, fever, weight loss, weight gain, vision loss, decreased hearing, hoarseness, chest pain, syncope, dyspnea on exertion, peripheral edema, prolonged cough, headaches, hemoptysis, abdominal pain, melena, hematochezia, severe indigestion/heartburn, hematuria, incontinence, genital sores, muscle weakness, suspicious skin lesions, transient blindness, difficulty walking, depression, unusual weight change, abnormal bleeding, enlarged lymph nodes, angioedema, breast masses, and testicular masses.    Physical Exam  General:  morbidly obese Neck:  No deformities, masses, or  tenderness noted. Lungs:  Normal respiratory effort, chest expands symmetrically. Lungs are clear to auscultation, no crackles or wheezes. Heart:  Normal rate and regular rhythm. S1 and S2 normal without gallop, murmur, click, rub or other extra sounds. Extremities:  trace leg edema   Impression & Recommendations:  Problem # 1:  PERIPHERAL NEUROPATHY (ICD-356.9)  Orders: TLB-B12, Serum-Total ONLY (16109-U04)  Problem # 2:  DIABETES MELLITUS, TYPE II (ICD-250.00)  The following medications were removed from the medication list:    Januvia 100 Mg Tabs (Sitagliptin phosphate) .Marland Kitchen... Take 1 tab by mouth daily Her updated medication list for this problem includes:    Metformin Hcl 500 Mg Tabs (Metformin hcl) .Marland Kitchen... Take two tablets twice daily    Enalapril Maleate 20 Mg Tabs (Enalapril maleate) .Marland Kitchen... 1/2 tab once daily    Aspirin 325 Mg Tabs (Aspirin) .Marland Kitchen... Take 1 tablet by mouth once a day    Lantus 100 Unit/ml Soln (Insulin glargine) .Marland KitchenMarland KitchenMarland KitchenMarland Kitchen 30 units at bedtime  Orders: Venipuncture (54098) TLB-BMP (Basic Metabolic Panel-BMET) (80048-METABOL) TLB-CBC Platelet - w/Differential (85025-CBCD) TLB-Hepatic/Liver Function Pnl (80076-HEPATIC) TLB-TSH (Thyroid Stimulating Hormone) (84443-TSH) TLB-Microalbumin/Creat Ratio, Urine (82043-MALB) TLB-A1C / Hgb A1C (Glycohemoglobin) (83036-A1C) UA Dipstick w/o Micro (automated)  (81003)  Problem # 3:  HYPERTENSION (ICD-401.9)  The following medications were removed from the medication list:    Furosemide 40 Mg Tabs (Furosemide) ..... Once daily Her updated medication list for this problem includes:    Enalapril Maleate 20 Mg Tabs (Enalapril maleate) .Marland Kitchen... 1/2 tab once daily  Complete Medication List: 1)  Metformin Hcl 500 Mg Tabs (Metformin hcl) .... Take two tablets twice daily 2)  Enalapril Maleate 20 Mg Tabs (Enalapril maleate) .... 1/2 tab once daily 3)  Aspirin 325 Mg Tabs (Aspirin) .... Take 1 tablet by mouth once a day 4)  Lantus 100  Unit/ml Soln (Insulin glargine) .... 30 units at bedtime 5)  Zocor 40 Mg Tabs (Simvastatin) .... Take 1 tablet by mouth once a day 6)  Tylenol Extra Strength 500 Mg Tabs (Acetaminophen) .... 2 tabs two times a day  Patient Instructions: 1)  We will check labs today including a B12 level. Encouraged her to exercise as much as possible. try Tylenol ES for joint pains.   Appended Document: CONSULT RE: B-12 INJ/CJR  Laboratory Results   Urine Tests    Routine Urinalysis   Color: yellow Appearance: Clear Glucose: negative   (Normal Range: Negative) Bilirubin: 1+   (Normal Range: Negative) Ketone: 1+   (Normal Range: Negative) Spec. Gravity: >=1.030   (Normal Range: 1.003-1.035) Blood: trace-intact   (Normal Range: Negative) pH: 6.5   (Normal Range: 5.0-8.0) Protein: 1+   (Normal Range: Negative) Urobilinogen: 0.2   (Normal Range: 0-1) Nitrite: negative   (Normal Range: Negative) Leukocyte Esterace: 3+   (Normal Range: Negative)    Comments: Rita Ohara  November 30, 2009 4:52 PM      Appended Document: CONSULT RE: B-12 INJ/CJR she has a UTI. Call in Cipro 500 mg two times a day for 7 days  Appended Document: CONSULT RE: B-12 INJ/CJR pt aware   Prescriptions: CIPRO 500 MG TABS (CIPROFLOXACIN HCL) 1 by mouth 2 times daily  #14 x 0   Entered by:   Alfred Levins, CMA   Authorized by:   Nelwyn Salisbury MD   Signed by:   Alfred Levins, CMA on 12/01/2009   Method used:   Electronically to        [facility]. # [phone]* (retail)       [address]       [place], [place]  [address]       Ph: [phone]       Fax: [phone]   RxID:   [id number]

## 2010-10-31 NOTE — Procedures (Signed)
Summary: Colonoscopy / [facility]  Colonoscopy / [facility]   Imported By: Lennie Odor 03/17/2010 17:08:53  _____________________________________________________________________  External Attachment:    Type:   Image     Comment:   External Document

## 2010-11-20 ENCOUNTER — Ambulatory Visit (INDEPENDENT_AMBULATORY_CARE_PROVIDER_SITE_OTHER): Payer: PRIVATE HEALTH INSURANCE | Admitting: Family Medicine

## 2010-11-20 ENCOUNTER — Encounter: Payer: Self-pay | Admitting: Family Medicine

## 2010-11-20 VITALS — BP 120/80 | HR 96 | Temp 98.6°F | Wt 190.0 lb

## 2010-11-20 DIAGNOSIS — H6120 Impacted cerumen, unspecified ear: Secondary | ICD-10-CM

## 2010-11-20 DIAGNOSIS — E119 Type 2 diabetes mellitus without complications: Secondary | ICD-10-CM

## 2010-11-20 DIAGNOSIS — I10 Essential (primary) hypertension: Secondary | ICD-10-CM

## 2010-11-20 NOTE — Progress Notes (Signed)
  Subjective:    Patient ID: Grace [name], female    DOB: Dec 18, 1940, 70 y.o.   MRN: [id number]  HPI Here to follow up on chronic problems and to describe 2 weeks of muffled hearing out of the right ear. No pain. No sinus symptoms. Her random glucoses lately have run from 150-230.    Review of Systems  Constitutional: Negative.   HENT: Positive for hearing loss and congestion. Negative for ear pain.   Respiratory: Negative.   Cardiovascular: Negative.        Objective:   Physical Exam  Constitutional: She appears well-developed and well-nourished. No distress.  HENT:       The right ear canal is full of cerumen   Cardiovascular: Normal rate, regular rhythm, normal heart sounds and intact distal pulses.  Exam reveals no gallop and no friction rub.   No murmur heard. Pulmonary/Chest: Effort normal and breath sounds normal. No respiratory distress. She has no wheezes. She has no rales. She exhibits no tenderness.          Assessment & Plan:  We will send her for labs today. We irrigated the ear canal clear with water.

## 2010-11-21 LAB — HEPATIC FUNCTION PANEL
ALT: 21 U/L (ref 0–35)
AST: 26 U/L (ref 0–37)
Albumin: 4.1 g/dL (ref 3.5–5.2)
Total Protein: 7 g/dL (ref 6.0–8.3)

## 2010-11-21 LAB — CBC WITH DIFFERENTIAL/PLATELET
Basophils Absolute: 0 10*3/uL (ref 0.0–0.1)
Eosinophils Relative: 1.8 % (ref 0.0–5.0)
HCT: 40.5 % (ref 36.0–46.0)
Lymphs Abs: 3.5 10*3/uL (ref 0.7–4.0)
Monocytes Relative: 4.6 % (ref 3.0–12.0)
Neutrophils Relative %: 53.4 % (ref 43.0–77.0)
Platelets: 139 10*3/uL — ABNORMAL LOW (ref 150.0–400.0)
RDW: 12.7 % (ref 11.5–14.6)
WBC: 8.9 10*3/uL (ref 4.5–10.5)

## 2010-11-21 LAB — BASIC METABOLIC PANEL
BUN: 23 mg/dL (ref 6–23)
Creatinine, Ser: 1.1 mg/dL (ref 0.4–1.2)
GFR: 65.62 mL/min (ref >60.00)
Glucose, Bld: 107 mg/dL — ABNORMAL HIGH (ref 70–99)
Potassium: 4.4 mEq/L (ref 3.5–5.1)

## 2010-11-21 LAB — HEMOGLOBIN A1C: Hgb A1c MFr Bld: 7.3 % — ABNORMAL HIGH (ref 4.6–6.5)

## 2010-11-21 LAB — TSH: TSH: 0.45 u[IU]/mL (ref 0.35–5.50)

## 2010-11-23 ENCOUNTER — Telehealth: Payer: Self-pay

## 2010-11-23 NOTE — Telephone Encounter (Signed)
Message copied by Madison Hickman on Thu Nov 23, 2010 11:02 AM ------      Message from: Dwaine Deter      Created: Wed Nov 22, 2010  4:57 PM       Normal except diabetes is slightly out of control. Watch the diet better

## 2010-11-23 NOTE — Telephone Encounter (Signed)
Pt aware.

## 2011-02-13 NOTE — Letter (Signed)
June 29, 2008    To Whom It May Concern.   RE:  [name], [name]  MRN:  [id number]  /  DOB:  [birth year]   This letter is concerning a patient of mine by the name of Grace [name] (date of birth, August 26, 1941).  I have served as this patient's  primary care Jennfier Abdulla since I met her in 2006.  She has struggled with  obesity all of her adult life and many efforts at losing weight have  been unsuccessful.  She is now consulting surgeons about the possibility  of bariatric surgery to help her lose weight, and this letter is to give  my support to this effort as her physician.  She has multiple lifelong  and serious medical conditions which are severely and adversely affected  by her weight.  These include type 2 diabetes mellitus, hypertension,  hyperlipidemia, and extensive osteoarthritis.  She has already had one  total knee replacement, and at this rate probably we will require more  because of the recurrent trauma to her joints from carrying around too  much weight.  She has been on various diets both supervised and  unsupervised over the years to no avail.  She has joined [employer]  and has strictly followed their regimen, but still being unable to lose  weight.  She has exercised both by herself and under the supervision of  personal trainers at exercise facilities, again to no avail.  She has  maintained a BMI of 40-50 for many years now, and this has severe  effects on her health.  I do support her efforts to get this bariatric  surgery.  I think she will have significant deleterious effects on her  quality of life and life expectancy if she does not have this done.    Sincerely,      Tera Mater. Clent Ridges, MD  Electronically Signed   SAF/MedQ  DD: 06/29/2008  DT: 06/30/2008  Job #: [id number]

## 2011-02-16 NOTE — Assessment & Plan Note (Signed)
[facility]                           GASTROENTEROLOGY OFFICE NOTE   NAME:Grace [name], Grace [name]                     MRN:          [id number]  DATE:06/11/2006                            DOB:          1940/11/27    PROBLEM:  For a screening colonoscopy.   HISTORY:  Grace [name] is a pleasant 70 year old African-American female with a  history of hypertension, insulin-dependent diabetes mellitus and  hyperlipidemia.  She is status post cholecystectomy.  She is referred today  for colon screening.  She denies any current problems.  Says that she is not  having any problems with abdominal discomfort. Her bowel movements have been  regular, she says sometimes too regular.  Post cholecystectomy she has not  noted any melena or hematochezia.   She does have complaints of frequent belching and burping.  No dysphagia or  odynophagia.   FAMILY HISTORY:  Positive for two brothers with colon polyps.   CURRENT MEDICATIONS:  1. Metformin 500 b.i.d.  2. Lovastatin 20 daily.  3. Lasix 40 b.i.d.  4. Enalapril 20 q. day.  5. Centrum Silver daily.  6. Lantus 60 units at h.s.  7. Aspirin 325 q. day.   ALLERGIES:  No known drug allergies.   PAST HISTORY:  As outlined above.   SOCIAL HISTORY:  Patient lives alone.  She is a non-smoker, non-drinker.  She has one child.   REVIEW OF SYSTEMS:  Pertinent for intermittent lower extremity edema.  Primary complaint today:  She said she was on a ladder yesterday and fell  off backwards and bumped the back of her head.  She is feeling sort of sore  all over today.  She did not have any syncope.  Denies headache today other  than locally where she hit her head.   PHYSICAL EXAMINATION:  GENERAL:  Well-developed, obese African-American  female in no acute distress.  VITAL SIGNS:  Height is 5 foot 2 inches.  Weight is 254.  Blood pressure  120/60.  Pulse is 100.  HEENT:  Unremarkable.  CARDIOVASCULAR:  Regular rate and rhythm  with S1 and S2.  PULMONARY:  Clear to auscultation and percussion.  ABDOMEN:  Obese, soft.  Bowel sounds are positive.  She is nontender.  There  is no mass or hepatosplenomegaly.  RECTAL:  Exam not done today. Patient relates negative exam per Dr. Clent Ridges.   LABORATORY:  Recent lab work showed WBC of 7.4.  Hemoglobin 11.3.  Hematocrit is 34.2.  MCV of 86.  These were done April 15, 2006.   IMPRESSION:  33. A 70 year old African-American female for screening colonoscopy,      asymptomatic.  2. Frequent belching and burping consistent with gastroesophageal reflux      disease.  3. Insulin-dependent diabetes mellitus.  4. Positive family history of colon polyps.   PLAN:  1. Schedule colonoscopy at her convenience.  We will ask her to hold her      aspirin for 5 days prior to procedure. Hold Lantus the evening of her      prep.  2. Patient was given samples of AcipHex 20 mg q.  a.m. and a prescription      today as a trial for her reflux symptoms.                                   Amy Esterwood, PA-C                                Vania Rea. Jarold Motto, MD, Clementeen Graham, FACP   AE/MedQ  DD:  06/11/2006  DT:  06/12/2006  Job #:  [id number]   cc:   Tera Mater. Clent Ridges, MD

## 2011-02-16 NOTE — Assessment & Plan Note (Signed)
[facility]                              [facility] OFFICE NOTE   NAME:Grace [name]                     MRN:          [id number]  DATE:04/22/2006                            DOB:          1940/10/11    This is a 70 year old woman, here for a complete physical examination.  In  general, she is doing well and has no particular complaints.  She says her  sugars seem to be doing well.  She brings with her a list of morning fasting  sugars over the past week.  These range from 64 to 137 with most of them  being around 100.   For further details of her past medical history, family history, social  history, habits, etc., I refer you to our introductory note with her, dated  January 26, 2005.  She has never had a colonoscopy.  She did have a normal  mammogram on January 16 of this year.   ALLERGIES:  None.   CURRENT MEDICATIONS:  1.  Lovastatin 20 mg per day.  2.  Metformin 500 mg two tablets twice daily.  3.  Enalapril 20 mg per day.  4.  Aspirin 325 mg per day.  5.  Lantus 60 units q.h.s.  6.  Lasix 40 mg twice daily.   She does try to watch her diet, but admits to getting very little exercise.   OBJECTIVE:  VITAL SIGNS:  Height 5 feet 3 inches, weight 256, BP 130/60,  pulse 80 and regular.  IN GENERAL:  She remains morbidly obese.  SKIN:  Free of significant lesions.  EYES:  Clear.  She wears glasses.  EARS:  Clear.  PHARYNX:  Clear.  NECK:  The neck is supple without lymphadenopathy or masses.  LUNGS:  Clear.  CARDIAC:  Rate and rhythm regular without gallops, murmurs or rubs.  Pulses  are full.  EKG is within normal limits.  BREASTS AND AXILLAE:  Clear.  ABDOMEN:  Soft, normal bowel sounds, nontender, no masses.  PELVIC EXAM:  External genitalia within normal limits.  Vagina is clear.  Cervix is clear.  Pap smear is obtained.  Uterus nonenlarged, no adnexal  masses or tenderness.  RECTAL EXAM:  No mass or tenderness.  Stool Hemoccult  negative.  EXTREMITIES:  No clubbing, cyanosis or edema.  NEUROLOGIC EXAM:  Grossly intact.   She was here for fasting labs on July 16.  These are remarkable for a lipid  panel showing an adequate HDL of 43, but LDL being slightly elevated at 93.  Metabolic panel was fine, as was her TSH.  Hemoglobin A1c was slightly  elevated at 7.2.   ASSESSMENT AND PLAN:  1.  Complete physical exam.  We again talked about increasing exercise and      the importance of losing weight.  We will set her up for her first      colonoscopy, as well.  2.  Hyperlipidemia.  We will switch to Simvastatin 40 mg once a day and we      will check labs again in six months.  3.  Hypertension, stable.  4.  Type 2 diabetes mellitus, stable.                                   Tera Mater. Clent Ridges, MD   SAF/MedQ  DD:  04/22/2006  DT:  04/22/2006  Job #:  [id number]

## 2011-05-11 ENCOUNTER — Other Ambulatory Visit: Payer: Self-pay | Admitting: Family Medicine

## 2011-06-11 ENCOUNTER — Encounter: Payer: Self-pay | Admitting: Family Medicine

## 2011-06-11 ENCOUNTER — Ambulatory Visit (INDEPENDENT_AMBULATORY_CARE_PROVIDER_SITE_OTHER): Payer: PRIVATE HEALTH INSURANCE | Admitting: Family Medicine

## 2011-06-11 VITALS — BP 118/62 | HR 77 | Temp 98.2°F | Wt 181.0 lb

## 2011-06-11 DIAGNOSIS — E119 Type 2 diabetes mellitus without complications: Secondary | ICD-10-CM

## 2011-06-11 DIAGNOSIS — E785 Hyperlipidemia, unspecified: Secondary | ICD-10-CM

## 2011-06-11 DIAGNOSIS — I10 Essential (primary) hypertension: Secondary | ICD-10-CM

## 2011-06-11 LAB — HEMOGLOBIN A1C: Hgb A1c MFr Bld: 7.1 % — ABNORMAL HIGH (ref 4.6–6.5)

## 2011-06-11 NOTE — Progress Notes (Signed)
  Subjective:    Patient ID: Grace [name], female    DOB: Nov 23, 1940, 70 y.o.   MRN: [id number]  HPI Here for a follow up. She feels good and has no complaints. Her am fasting glucoses range from 100 to 150. She is not fasting today.   Review of Systems  Constitutional: Negative.   Respiratory: Negative.   Cardiovascular: Negative.        Objective:   Physical Exam  Constitutional: She appears well-developed and well-nourished.  Cardiovascular: Normal rate, regular rhythm, normal heart sounds and intact distal pulses.   Pulmonary/Chest: Effort normal and breath sounds normal.          Assessment & Plan:  Get labs today. Get more exercise.

## 2011-06-12 LAB — BASIC METABOLIC PANEL
BUN: 16 mg/dL (ref 6–23)
Chloride: 107 mEq/L (ref 96–112)
Glucose, Bld: 142 mg/dL — ABNORMAL HIGH (ref 70–99)
Potassium: 4.1 mEq/L (ref 3.5–5.1)
Sodium: 143 mEq/L (ref 135–145)

## 2011-06-14 ENCOUNTER — Telehealth: Payer: Self-pay | Admitting: Family Medicine

## 2011-06-14 NOTE — Telephone Encounter (Signed)
Message copied by Baldemar Friday on Thu Jun 14, 2011  3:21 PM ------      Message from: Gershon Crane A      Created: Thu Jun 14, 2011  9:18 AM       Normal, watch the diet

## 2011-06-14 NOTE — Telephone Encounter (Signed)
Left voice message with results.

## 2011-08-27 ENCOUNTER — Other Ambulatory Visit: Payer: Self-pay | Admitting: Family Medicine

## 2011-08-27 NOTE — Telephone Encounter (Signed)
Pt is out of med

## 2011-09-05 ENCOUNTER — Ambulatory Visit: Payer: Self-pay | Admitting: Family Medicine

## 2011-09-10 ENCOUNTER — Encounter: Payer: Self-pay | Admitting: Gastroenterology

## 2011-09-19 ENCOUNTER — Encounter: Payer: Self-pay | Admitting: Family Medicine

## 2011-09-20 ENCOUNTER — Other Ambulatory Visit: Payer: Self-pay | Admitting: Family Medicine

## 2011-09-21 ENCOUNTER — Other Ambulatory Visit: Payer: Self-pay | Admitting: Family Medicine

## 2011-11-14 ENCOUNTER — Encounter: Payer: Self-pay | Admitting: Family Medicine

## 2011-11-14 ENCOUNTER — Ambulatory Visit (INDEPENDENT_AMBULATORY_CARE_PROVIDER_SITE_OTHER): Payer: PRIVATE HEALTH INSURANCE | Admitting: Family Medicine

## 2011-11-14 VITALS — BP 112/64 | HR 83 | Temp 97.6°F | Ht 60.25 in | Wt 177.0 lb

## 2011-11-14 DIAGNOSIS — K635 Polyp of colon: Secondary | ICD-10-CM

## 2011-11-14 DIAGNOSIS — I10 Essential (primary) hypertension: Secondary | ICD-10-CM

## 2011-11-14 DIAGNOSIS — E785 Hyperlipidemia, unspecified: Secondary | ICD-10-CM

## 2011-11-14 DIAGNOSIS — E119 Type 2 diabetes mellitus without complications: Secondary | ICD-10-CM

## 2011-11-14 DIAGNOSIS — Z0000 Encounter for general adult medical examination without abnormal findings: Secondary | ICD-10-CM

## 2011-11-14 DIAGNOSIS — D126 Benign neoplasm of colon, unspecified: Secondary | ICD-10-CM

## 2011-11-14 LAB — BASIC METABOLIC PANEL
CO2: 29 mEq/L (ref 19–32)
Chloride: 106 mEq/L (ref 96–112)
Glucose, Bld: 85 mg/dL (ref 70–99)
Sodium: 142 mEq/L (ref 135–145)

## 2011-11-14 LAB — HEMOGLOBIN A1C: Hgb A1c MFr Bld: 7.1 % — ABNORMAL HIGH (ref 4.6–6.5)

## 2011-11-14 LAB — CBC WITH DIFFERENTIAL/PLATELET
Eosinophils Relative: 2.4 % (ref 0.0–5.0)
HCT: 40.2 % (ref 36.0–46.0)
Lymphs Abs: 2.4 10*3/uL (ref 0.7–4.0)
MCHC: 33 g/dL (ref 30.0–36.0)
MCV: 90.7 fl (ref 78.0–100.0)
Monocytes Absolute: 0.4 10*3/uL (ref 0.1–1.0)
Platelets: 132 10*3/uL — ABNORMAL LOW (ref 150.0–400.0)
RDW: 13.6 % (ref 11.5–14.6)
WBC: 6.3 10*3/uL (ref 4.5–10.5)

## 2011-11-14 LAB — POCT URINALYSIS DIPSTICK
Blood, UA: NEGATIVE
Protein, UA: NEGATIVE
Spec Grav, UA: 1.015
Urobilinogen, UA: 0.2
pH, UA: 7.5

## 2011-11-14 LAB — HEPATIC FUNCTION PANEL
ALT: 22 U/L (ref 0–35)
AST: 24 U/L (ref 0–37)
Alkaline Phosphatase: 99 U/L (ref 39–117)
Bilirubin, Direct: 0 mg/dL (ref 0.0–0.3)
Total Protein: 7.1 g/dL (ref 6.0–8.3)

## 2011-11-14 LAB — LIPID PANEL: Total CHOL/HDL Ratio: 2

## 2011-11-14 MED ORDER — DOCUSATE SODIUM 100 MG PO CAPS: 100.0000 mg | ORAL_CAPSULE | Freq: Two times a day (BID) | ORAL | Status: AC

## 2011-11-14 MED ORDER — SIMVASTATIN 40 MG PO TABS: 40.0000 mg | ORAL_TABLET | Freq: Every day | ORAL | Status: DC

## 2011-11-14 MED ORDER — ENALAPRIL MALEATE 5 MG PO TABS: 5.0000 mg | ORAL_TABLET | Freq: Every day | ORAL | Status: DC

## 2011-11-14 MED ORDER — METFORMIN HCL 500 MG PO TABS: 1000.0000 mg | ORAL_TABLET | Freq: Two times a day (BID) | ORAL | Status: DC

## 2011-11-14 MED ORDER — INSULIN GLARGINE 100 UNIT/ML ~~LOC~~ SOLN: 15.0000 [IU] | Freq: Every day | SUBCUTANEOUS | Status: DC

## 2011-11-14 MED ORDER — FUROSEMIDE 40 MG PO TABS: 40.0000 mg | ORAL_TABLET | Freq: Every day | ORAL | Status: AC

## 2011-11-14 MED ORDER — FERROUS SULFATE 325 (65 FE) MG PO TABS: 325.0000 mg | ORAL_TABLET | Freq: Every day | ORAL | Status: DC

## 2011-11-14 MED ORDER — OMEPRAZOLE 20 MG PO CPDR: 20.0000 mg | DELAYED_RELEASE_CAPSULE | Freq: Every day | ORAL | Status: DC

## 2011-11-14 NOTE — Progress Notes (Signed)
Subjective:    Patient ID: Grace [name], female    DOB: 10/10/1940, 71 y.o.   MRN: [id number]  HPI 71 yr old female for a cpx. She feels well but does complain of constipation. She has tried Miralax with no effects. She is currently using Dulcolax OTC but would like a rx for something to use. Her glucoses are stable in the range of 90 to 125.    Review of Systems  Constitutional: Negative.  Negative for fever, diaphoresis, activity change, appetite change, fatigue and unexpected weight change.  HENT: Negative.  Negative for hearing loss, ear pain, nosebleeds, congestion, sore throat, trouble swallowing, neck pain, neck stiffness, voice change and tinnitus.   Eyes: Negative.  Negative for photophobia, pain, discharge, redness and visual disturbance.  Respiratory: Negative.  Negative for apnea, cough, choking, chest tightness, shortness of breath, wheezing and stridor.   Cardiovascular: Negative.  Negative for chest pain, palpitations and leg swelling.  Gastrointestinal: Negative.  Negative for nausea, vomiting, abdominal pain, diarrhea, constipation, blood in stool, abdominal distention and rectal pain.  Genitourinary: Negative.  Negative for dysuria, urgency, frequency, hematuria, flank pain, vaginal bleeding, vaginal discharge, enuresis, difficulty urinating, vaginal pain and menstrual problem.  Musculoskeletal: Negative.  Negative for myalgias, back pain, joint swelling, arthralgias and gait problem.  Skin: Negative.  Negative for color change, pallor, rash and wound.  Neurological: Negative.  Negative for dizziness, tremors, seizures, syncope, speech difficulty, weakness, light-headedness, numbness and headaches.  Hematological: Negative.  Negative for adenopathy. Does not bruise/bleed easily.  Psychiatric/Behavioral: Negative.  Negative for hallucinations, behavioral problems, confusion, sleep disturbance, dysphoric mood and agitation. The patient is not nervous/anxious.          Objective:   Physical Exam  Constitutional: She appears well-developed and well-nourished. No distress.  HENT:  Head: Normocephalic and atraumatic.  Right Ear: External ear normal.  Left Ear: External ear normal.  Nose: Nose normal.  Mouth/Throat: Oropharynx is clear and moist. No oropharyngeal exudate.  Eyes: Conjunctivae and EOM are normal. Pupils are equal, round, and reactive to light. Right eye exhibits no discharge. Left eye exhibits no discharge. No scleral icterus.  Neck: Normal range of motion. Neck supple. No JVD present. No thyromegaly present.  Cardiovascular: Normal rate, regular rhythm, normal heart sounds and intact distal pulses.  Exam reveals no gallop and no friction rub.   No murmur heard.      EKG normal   Pulmonary/Chest: Effort normal and breath sounds normal. No stridor. No respiratory distress. She has no wheezes. She has no rales. She exhibits no tenderness.  Abdominal: Soft. Normal appearance and bowel sounds are normal. She exhibits no distension, no abdominal bruit, no ascites and no mass. There is no hepatosplenomegaly. There is no tenderness. There is no rigidity, no rebound and no guarding. No hernia.  Genitourinary: Rectum normal, vagina normal and uterus normal. No breast swelling, tenderness, discharge or bleeding. Cervix exhibits no motion tenderness, no discharge and no friability. Right adnexum displays no mass, no tenderness and no fullness. Left adnexum displays no mass, no tenderness and no fullness. No erythema, tenderness or bleeding around the vagina. No vaginal discharge found.  Musculoskeletal: Normal range of motion. She exhibits no edema and no tenderness.  Lymphadenopathy:    She has no cervical adenopathy.  Neurological: She is alert. She has normal reflexes. No cranial nerve deficit. She exhibits normal muscle tone. Coordination normal.  Skin: Skin is warm and dry. No rash noted. She is not diaphoretic. No erythema. No pallor.  Psychiatric:  She has a normal mood and affect. Her behavior is normal. Judgment and thought content normal.          Assessment & Plan:  Well exam. Get fasting labs. Try Colace bid. She is due for another colonoscopy this year

## 2011-11-16 NOTE — Progress Notes (Signed)
Quick Note:  Left a message for pt to return call. ______ 

## 2011-11-19 ENCOUNTER — Telehealth: Payer: Self-pay | Admitting: Family Medicine

## 2011-11-19 NOTE — Telephone Encounter (Signed)
I am not sure what else to try. I suggest she see Dr. Jarold Motto, her GI, about this. If she agrees, I will do a referral

## 2011-11-19 NOTE — Telephone Encounter (Signed)
Spoke with pt and she does want a referral and would prefer to get someone in [place]. I explained that once the referral order was in, then Terri from this office would be in touch.

## 2011-11-19 NOTE — Telephone Encounter (Signed)
Left voice message for pt to return my call.

## 2011-11-19 NOTE — Telephone Encounter (Signed)
Addended by: Gershon Crane A on: 11/19/2011 05:20 PM   Modules accepted: Orders

## 2011-11-19 NOTE — Telephone Encounter (Signed)
We will send her to a new GI

## 2011-11-19 NOTE — Telephone Encounter (Signed)
Pt is still suffering from constipation, is there anything else you can recommend?

## 2011-12-20 ENCOUNTER — Encounter: Payer: Self-pay | Admitting: Gastroenterology

## 2012-04-11 ENCOUNTER — Ambulatory Visit: Payer: Self-pay | Admitting: Gastroenterology

## 2012-04-16 ENCOUNTER — Telehealth: Payer: Self-pay | Admitting: Family Medicine

## 2012-04-16 NOTE — Telephone Encounter (Signed)
Caller: Lorrena/Patient; PCP: Nelwyn Salisbury.; CB#: [phone]; ; ; Call regarding Medication Change Request;   Patient states she had a Colonsocopy 04/11/12. States they were only able to do a "partial colonoscopy" because he was not cleaned out. States she was informed by the Gastroenterologist that Metformin was "binding me up." Patient states BS has been averaging 91-93. Patient states she has frequent constipation and hard stools. Patient denies constipation at present. Patient states she had a loose BM 04/15/12 and 04/16/12. Triage per Constipation Protocol. No emergent sx identified. Care advice and diet advice given per guidelines. Call back parameters reviewed. Patient verbalizes understanding.  PATIENT REQUESTING FOR METFORMIN TO BE CHANGED TO ANOTHER DIABETES MEDICATION. PATIENT USES [facility] IN [place] @ [phone]. PATIENT CAN BE REACHED @ [phone]. PATIENT GIVES AUTHROIZATION TO LEAVE VOICE MAIL MESSAGE.

## 2012-04-17 NOTE — Telephone Encounter (Signed)
First off, Metformin does NOT slow the bowels down. In fact its most common side effect is diarrhea. Therefore this had nothing to do with the colonoscopy prep. Usually diabetes itself may slow the colon down. She can try cutting the Metformin dose in half to one 500 mg tablet bid instead of two.

## 2012-04-18 NOTE — Telephone Encounter (Signed)
I spoke with pt and gave below information.  

## 2012-04-23 ENCOUNTER — Encounter: Payer: Self-pay | Admitting: Family Medicine

## 2012-05-20 ENCOUNTER — Other Ambulatory Visit: Payer: Self-pay | Admitting: Family Medicine

## 2012-05-26 ENCOUNTER — Encounter: Payer: Self-pay | Admitting: Family Medicine

## 2012-05-26 ENCOUNTER — Ambulatory Visit (INDEPENDENT_AMBULATORY_CARE_PROVIDER_SITE_OTHER): Payer: PRIVATE HEALTH INSURANCE | Admitting: Family Medicine

## 2012-05-26 VITALS — BP 114/68 | HR 82 | Temp 98.8°F | Wt 175.0 lb

## 2012-05-26 DIAGNOSIS — I10 Essential (primary) hypertension: Secondary | ICD-10-CM

## 2012-05-26 DIAGNOSIS — E785 Hyperlipidemia, unspecified: Secondary | ICD-10-CM

## 2012-05-26 DIAGNOSIS — E119 Type 2 diabetes mellitus without complications: Secondary | ICD-10-CM

## 2012-05-26 LAB — POCT URINALYSIS DIPSTICK
Bilirubin, UA: NEGATIVE
Glucose, UA: NEGATIVE
Leukocytes, UA: NEGATIVE
pH, UA: 6

## 2012-05-26 LAB — CBC WITH DIFFERENTIAL/PLATELET
Basophils Relative: 0.6 % (ref 0.0–3.0)
Eosinophils Relative: 2.2 % (ref 0.0–5.0)
Hemoglobin: 12.7 g/dL (ref 12.0–15.0)
Lymphocytes Relative: 41.7 % (ref 12.0–46.0)
MCHC: 32.7 g/dL (ref 30.0–36.0)
Monocytes Relative: 7.1 % (ref 3.0–12.0)
Neutro Abs: 3.2 10*3/uL (ref 1.4–7.7)
RBC: 4.3 Mil/uL (ref 3.87–5.11)
WBC: 6.6 10*3/uL (ref 4.5–10.5)

## 2012-05-26 LAB — BASIC METABOLIC PANEL
Chloride: 104 mEq/L (ref 96–112)
Creatinine, Ser: 0.9 mg/dL (ref 0.4–1.2)
Potassium: 3.9 mEq/L (ref 3.5–5.1)

## 2012-05-26 LAB — HEPATIC FUNCTION PANEL
AST: 25 U/L (ref 0–37)
Total Bilirubin: 0.5 mg/dL (ref 0.3–1.2)

## 2012-05-26 LAB — LIPID PANEL
Cholesterol: 133 mg/dL (ref 0–200)
LDL Cholesterol: 50 mg/dL (ref 0–99)
Total CHOL/HDL Ratio: 2
VLDL: 12.8 mg/dL (ref 0.0–40.0)

## 2012-05-26 LAB — TSH: TSH: 1.69 u[IU]/mL (ref 0.35–5.50)

## 2012-05-26 LAB — HEMOGLOBIN A1C: Hgb A1c MFr Bld: 7.4 % — ABNORMAL HIGH (ref 4.6–6.5)

## 2012-05-26 NOTE — Progress Notes (Signed)
  Subjective:    Patient ID: UNIQUE SILLAS, female    DOB: 1941/09/04, 72 y.o.   MRN: [id number]  HPI Here to follow up. She feels good and tries to exercise. Her fasting glucose today is 74.    Review of Systems  Constitutional: Negative.   Respiratory: Negative.   Cardiovascular: Negative.        Objective:   Physical Exam  Constitutional: She appears well-developed and well-nourished.  Neck: No thyromegaly present.  Cardiovascular: Normal rate, regular rhythm, normal heart sounds and intact distal pulses.   Pulmonary/Chest: Effort normal and breath sounds normal.  Lymphadenopathy:    She has no cervical adenopathy.          Assessment & Plan:  Get fasting labs today.

## 2012-06-04 DIAGNOSIS — Z0279 Encounter for issue of other medical certificate: Secondary | ICD-10-CM

## 2012-06-09 ENCOUNTER — Telehealth: Payer: Self-pay | Admitting: Family Medicine

## 2012-06-09 NOTE — Telephone Encounter (Signed)
Caller: Xoey/Patient; Patient Name: Grace [name]; PCP: Gershon Crane [facility]); Best Callback Phone Number: [phone]; Call regarding Constipation, onset 1 week.  Patient has history of blockages, unable to preform Colonstomy in June.  Patient has taken Mirilax 1 week, no success.  Glucometer is currently broken, one is in the mail per Patient.  All emergent symptoms ruled out per Diabetes Gastrointestinal Problems Protocol, see in 72 hours.  Appointment scheduled on 9-10 at 1030 with Dr Clent Ridges due to constipation over 2 days no reolve with home care.  Patient verbalized understanding.

## 2012-06-10 ENCOUNTER — Encounter: Payer: Self-pay | Admitting: Family Medicine

## 2012-06-10 ENCOUNTER — Ambulatory Visit (INDEPENDENT_AMBULATORY_CARE_PROVIDER_SITE_OTHER): Payer: PRIVATE HEALTH INSURANCE | Admitting: Family Medicine

## 2012-06-10 VITALS — BP 106/68 | HR 88 | Temp 98.6°F | Wt 174.0 lb

## 2012-06-10 DIAGNOSIS — I10 Essential (primary) hypertension: Secondary | ICD-10-CM

## 2012-06-10 DIAGNOSIS — D649 Anemia, unspecified: Secondary | ICD-10-CM

## 2012-06-10 DIAGNOSIS — K5900 Constipation, unspecified: Secondary | ICD-10-CM

## 2012-06-10 MED ORDER — TRIAMCINOLONE ACETONIDE 0.5 % EX CREA: TOPICAL_CREAM | Freq: Two times a day (BID) | CUTANEOUS | Status: DC

## 2012-06-10 NOTE — Progress Notes (Signed)
  Subjective:    Patient ID: Grace [name], female    DOB: 03/24/1941, 71 y.o.   MRN: [id number]  HPI Here for constipation problems. She has been taking an iron supplement for a year or so, and this seems to stop her up. No abdominal pain or fever or nausea. Good appetite. She takes Miralax daily. She had not had a BM for one week until she used a glycerin suppository last night, and she had a small BM after that.    Review of Systems  Constitutional: Negative.   Gastrointestinal: Positive for constipation. Negative for nausea, vomiting, abdominal pain, diarrhea, blood in stool, abdominal distention and rectal pain.       Objective:   Physical Exam  Constitutional: She appears well-developed and well-nourished.  Abdominal: Soft. Bowel sounds are normal. She exhibits no distension and no mass. There is no tenderness. There is no rebound and no guarding.          Assessment & Plan:  She will drink a bottle of magnesium citrate tonight. After that, continue taking Miralax daily and add one tbsp of Milk of Magnesia twice daily. Stop the iron supplement. Drink plenty of water.

## 2012-08-18 ENCOUNTER — Other Ambulatory Visit: Payer: Self-pay | Admitting: Family Medicine

## 2012-09-09 ENCOUNTER — Ambulatory Visit: Payer: Self-pay

## 2012-11-11 ENCOUNTER — Other Ambulatory Visit: Payer: Self-pay | Admitting: Family Medicine

## 2013-03-19 ENCOUNTER — Other Ambulatory Visit: Payer: Self-pay | Admitting: Family Medicine

## 2013-03-20 NOTE — Telephone Encounter (Signed)
Can we refill this? 

## 2013-05-08 ENCOUNTER — Ambulatory Visit: Payer: Self-pay | Admitting: Gastroenterology

## 2013-09-15 ENCOUNTER — Ambulatory Visit: Payer: Self-pay | Admitting: General Practice

## 2014-09-16 ENCOUNTER — Ambulatory Visit: Payer: Self-pay | Admitting: Internal Medicine

## 2015-12-12 ENCOUNTER — Other Ambulatory Visit: Payer: Self-pay | Admitting: Internal Medicine

## 2015-12-12 DIAGNOSIS — Z1231 Encounter for screening mammogram for malignant neoplasm of breast: Secondary | ICD-10-CM

## 2015-12-14 ENCOUNTER — Other Ambulatory Visit: Payer: Self-pay | Admitting: Internal Medicine

## 2015-12-14 ENCOUNTER — Ambulatory Visit
Admission: RE | Admit: 2015-12-14 | Discharge: 2015-12-14 | Disposition: A | Discharge status: Home / Self Care | Payer: Medicare HMO | Source: Ambulatory Visit | Attending: Internal Medicine | Admitting: Internal Medicine

## 2015-12-14 DIAGNOSIS — Z1231 Encounter for screening mammogram for malignant neoplasm of breast: Secondary | ICD-10-CM

## 2016-09-01 ENCOUNTER — Emergency Department
Admission: EM | Admit: 2016-09-01 | Discharge: 2016-09-01 | Disposition: A | Discharge status: Home / Self Care | Payer: Medicare HMO | Attending: Student in an Organized Health Care Education/Training Program | Admitting: Student in an Organized Health Care Education/Training Program

## 2016-09-01 ENCOUNTER — Encounter: Payer: Self-pay | Admitting: Emergency Medicine

## 2016-09-01 ENCOUNTER — Emergency Department: Payer: Medicare HMO

## 2016-09-01 DIAGNOSIS — Y999 Unspecified external cause status: Secondary | ICD-10-CM | POA: Diagnosis not present

## 2016-09-01 DIAGNOSIS — Y92410 Unspecified street and highway as the place of occurrence of the external cause: Secondary | ICD-10-CM | POA: Insufficient documentation

## 2016-09-01 DIAGNOSIS — R109 Unspecified abdominal pain: Secondary | ICD-10-CM | POA: Insufficient documentation

## 2016-09-01 DIAGNOSIS — Z853 Personal history of malignant neoplasm of breast: Secondary | ICD-10-CM | POA: Diagnosis not present

## 2016-09-01 DIAGNOSIS — Z794 Long term (current) use of insulin: Secondary | ICD-10-CM | POA: Diagnosis not present

## 2016-09-01 DIAGNOSIS — E119 Type 2 diabetes mellitus without complications: Secondary | ICD-10-CM | POA: Insufficient documentation

## 2016-09-01 DIAGNOSIS — I10 Essential (primary) hypertension: Secondary | ICD-10-CM | POA: Insufficient documentation

## 2016-09-01 DIAGNOSIS — Z79899 Other long term (current) drug therapy: Secondary | ICD-10-CM | POA: Insufficient documentation

## 2016-09-01 DIAGNOSIS — R0789 Other chest pain: Secondary | ICD-10-CM | POA: Insufficient documentation

## 2016-09-01 DIAGNOSIS — S0990XA Unspecified injury of head, initial encounter: Secondary | ICD-10-CM | POA: Diagnosis present

## 2016-09-01 DIAGNOSIS — S0003XA Contusion of scalp, initial encounter: Secondary | ICD-10-CM | POA: Insufficient documentation

## 2016-09-01 DIAGNOSIS — Z7982 Long term (current) use of aspirin: Secondary | ICD-10-CM | POA: Insufficient documentation

## 2016-09-01 DIAGNOSIS — I2510 Atherosclerotic heart disease of native coronary artery without angina pectoris: Secondary | ICD-10-CM | POA: Diagnosis not present

## 2016-09-01 DIAGNOSIS — Y9389 Activity, other specified: Secondary | ICD-10-CM | POA: Insufficient documentation

## 2016-09-01 LAB — COMPREHENSIVE METABOLIC PANEL
ALBUMIN: 4.1 g/dL (ref 3.5–5.0)
ALT: 15 U/L (ref 14–54)
ANION GAP: 7 (ref 5–15)
AST: 30 U/L (ref 15–41)
Alkaline Phosphatase: 103 U/L (ref 38–126)
BUN: 9 mg/dL (ref 6–20)
CHLORIDE: 101 mmol/L (ref 101–111)
CO2: 29 mmol/L (ref 22–32)
Calcium: 10 mg/dL (ref 8.9–10.3)
Creatinine, Ser: 0.76 mg/dL (ref 0.44–1.00)
GFR calc Af Amer: >60 mL/min (ref >60)
GFR calc non Af Amer: >60 mL/min (ref >60)
GLUCOSE: 184 mg/dL — AB (ref 65–99)
POTASSIUM: 3.5 mmol/L (ref 3.5–5.1)
SODIUM: 137 mmol/L (ref 135–145)
Total Bilirubin: 0.1 mg/dL — ABNORMAL LOW (ref 0.3–1.2)
Total Protein: 7.8 g/dL (ref 6.5–8.1)

## 2016-09-01 LAB — CBC WITH DIFFERENTIAL/PLATELET
BASOS PCT: 1 %
Basophils Absolute: 0 10*3/uL (ref 0–0.1)
EOS ABS: 0.1 10*3/uL (ref 0–0.7)
Eosinophils Relative: 2 %
HCT: 38.8 % (ref 35.0–47.0)
HEMOGLOBIN: 12.7 g/dL (ref 12.0–16.0)
Lymphocytes Relative: 37 %
Lymphs Abs: 2.7 10*3/uL (ref 1.0–3.6)
MCH: 28.1 pg (ref 26.0–34.0)
MCHC: 32.8 g/dL (ref 32.0–36.0)
MCV: 85.8 fL (ref 80.0–100.0)
MONOS PCT: 7 %
Monocytes Absolute: 0.5 10*3/uL (ref 0.2–0.9)
NEUTROS PCT: 53 %
Neutro Abs: 3.8 10*3/uL (ref 1.4–6.5)
Platelets: 150 10*3/uL (ref 150–440)
RBC: 4.52 MIL/uL (ref 3.80–5.20)
RDW: 14.1 % (ref 11.5–14.5)
WBC: 7.2 10*3/uL (ref 3.6–11.0)

## 2016-09-01 LAB — TROPONIN I: Troponin I: <0.03 ng/mL (ref <0.03)

## 2016-09-01 MED ORDER — BACLOFEN 10 MG PO TABS: 10.0000 mg | ORAL_TABLET | Freq: Two times a day (BID) | ORAL | 0 refills | Status: AC

## 2016-09-01 MED ORDER — FENTANYL CITRATE (PF) 100 MCG/2ML IJ SOLN
50.0000 ug | INTRAMUSCULAR | Status: DC | PRN
  Administered 2016-09-01: 50 ug via INTRAVENOUS
  Filled 2016-09-01: qty 2

## 2016-09-01 MED ORDER — ACETAMINOPHEN 500 MG PO TABS
1000.0000 mg | ORAL_TABLET | Freq: Once | ORAL | Status: AC
  Administered 2016-09-01: 1000 mg via ORAL
  Filled 2016-09-01: qty 2

## 2016-09-01 MED ORDER — IOPAMIDOL (ISOVUE-300) INJECTION 61%
100.0000 mL | Freq: Once | INTRAVENOUS | Status: AC | PRN
  Administered 2016-09-01: 100 mL via INTRAVENOUS

## 2016-09-01 NOTE — ED Notes (Signed)
Patient transported to CT 

## 2016-09-01 NOTE — ED Notes (Signed)
Pt declines offer for additional pain medication. Call bell by left hand. Pt handed phone and purse to attempt to contact family. Pt denies further needs currently.

## 2016-09-01 NOTE — ED Notes (Signed)
Patient ambulatory with steady gait.

## 2016-09-01 NOTE — ED Notes (Signed)
Pt with anterior left sided chest wall tenderness to palpation. Pt states it is painful when she takes a deep breath. No obvious bruising noted to body with exception of two abrasions noted to medial forehead with controlled bleeding. Pt with perrl 60mm and brisk, cms intact in all extremities. Pt denies abd pain, rigid c collar in place, call bell in left hand.

## 2016-09-01 NOTE — ED Notes (Signed)
Pt speaking on phone. Pt updated on treatment process.
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
## 2016-09-01 NOTE — ED Notes (Signed)
Pt returned from ct scan, pt repositioned for comfort in bed, warm blankets provided. Call bell at side.

## 2016-09-01 NOTE — ED Notes (Signed)
Pt provided with sandwich and po fluids per md request. Call bell at left side. Pt denies further needs.

## 2016-11-01 ENCOUNTER — Other Ambulatory Visit: Payer: Self-pay | Admitting: Internal Medicine

## 2016-11-01 DIAGNOSIS — Z1231 Encounter for screening mammogram for malignant neoplasm of breast: Secondary | ICD-10-CM

## 2016-12-17 ENCOUNTER — Ambulatory Visit
Admission: RE | Admit: 2016-12-17 | Discharge: 2016-12-17 | Disposition: A | Discharge status: Home / Self Care | Payer: Medicare HMO | Source: Ambulatory Visit | Attending: Internal Medicine | Admitting: Internal Medicine

## 2016-12-17 DIAGNOSIS — Z1231 Encounter for screening mammogram for malignant neoplasm of breast: Secondary | ICD-10-CM | POA: Diagnosis present

## 2017-11-13 ENCOUNTER — Other Ambulatory Visit: Payer: Self-pay | Admitting: Internal Medicine

## 2017-11-13 DIAGNOSIS — Z1231 Encounter for screening mammogram for malignant neoplasm of breast: Secondary | ICD-10-CM

## 2017-12-18 ENCOUNTER — Ambulatory Visit
Admission: RE | Admit: 2017-12-18 | Discharge: 2017-12-18 | Disposition: A | Discharge status: Home / Self Care | Payer: Medicare HMO | Source: Ambulatory Visit | Attending: Internal Medicine | Admitting: Internal Medicine

## 2017-12-18 DIAGNOSIS — Z1231 Encounter for screening mammogram for malignant neoplasm of breast: Secondary | ICD-10-CM | POA: Insufficient documentation

## 2018-10-25 ENCOUNTER — Inpatient Hospital Stay
Admission: EM | Admit: 2018-10-25 | Discharge: 2018-10-28 | DRG: 287 | Disposition: A | Discharge status: Home Health | Payer: Medicare HMO | Attending: Internal Medicine | Admitting: Internal Medicine

## 2018-10-25 ENCOUNTER — Other Ambulatory Visit: Payer: Self-pay

## 2018-10-25 ENCOUNTER — Emergency Department: Payer: Medicare HMO

## 2018-10-25 DIAGNOSIS — Z9049 Acquired absence of other specified parts of digestive tract: Secondary | ICD-10-CM

## 2018-10-25 DIAGNOSIS — Z91048 Other nonmedicinal substance allergy status: Secondary | ICD-10-CM | POA: Diagnosis not present

## 2018-10-25 DIAGNOSIS — Z87891 Personal history of nicotine dependence: Secondary | ICD-10-CM | POA: Diagnosis not present

## 2018-10-25 DIAGNOSIS — E785 Hyperlipidemia, unspecified: Secondary | ICD-10-CM | POA: Diagnosis present

## 2018-10-25 DIAGNOSIS — Z833 Family history of diabetes mellitus: Secondary | ICD-10-CM

## 2018-10-25 DIAGNOSIS — M1711 Unilateral primary osteoarthritis, right knee: Secondary | ICD-10-CM | POA: Diagnosis present

## 2018-10-25 DIAGNOSIS — Z803 Family history of malignant neoplasm of breast: Secondary | ICD-10-CM

## 2018-10-25 DIAGNOSIS — I110 Hypertensive heart disease with heart failure: Principal | ICD-10-CM | POA: Diagnosis present

## 2018-10-25 DIAGNOSIS — Z96659 Presence of unspecified artificial knee joint: Secondary | ICD-10-CM | POA: Diagnosis present

## 2018-10-25 DIAGNOSIS — I5023 Acute on chronic systolic (congestive) heart failure: Secondary | ICD-10-CM | POA: Diagnosis present

## 2018-10-25 DIAGNOSIS — R0902 Hypoxemia: Secondary | ICD-10-CM | POA: Diagnosis present

## 2018-10-25 DIAGNOSIS — I428 Other cardiomyopathies: Secondary | ICD-10-CM | POA: Diagnosis present

## 2018-10-25 DIAGNOSIS — K219 Gastro-esophageal reflux disease without esophagitis: Secondary | ICD-10-CM | POA: Diagnosis present

## 2018-10-25 DIAGNOSIS — Z8261 Family history of arthritis: Secondary | ICD-10-CM

## 2018-10-25 DIAGNOSIS — E669 Obesity, unspecified: Secondary | ICD-10-CM | POA: Diagnosis present

## 2018-10-25 DIAGNOSIS — Z79899 Other long term (current) drug therapy: Secondary | ICD-10-CM | POA: Diagnosis not present

## 2018-10-25 DIAGNOSIS — Z794 Long term (current) use of insulin: Secondary | ICD-10-CM

## 2018-10-25 DIAGNOSIS — Z79890 Hormone replacement therapy (postmenopausal): Secondary | ICD-10-CM

## 2018-10-25 DIAGNOSIS — G8929 Other chronic pain: Secondary | ICD-10-CM | POA: Diagnosis present

## 2018-10-25 DIAGNOSIS — Z7982 Long term (current) use of aspirin: Secondary | ICD-10-CM | POA: Diagnosis not present

## 2018-10-25 DIAGNOSIS — Z6833 Body mass index (BMI) 33.0-33.9, adult: Secondary | ICD-10-CM

## 2018-10-25 DIAGNOSIS — R0600 Dyspnea, unspecified: Secondary | ICD-10-CM

## 2018-10-25 DIAGNOSIS — E039 Hypothyroidism, unspecified: Secondary | ICD-10-CM | POA: Diagnosis present

## 2018-10-25 DIAGNOSIS — F5104 Psychophysiologic insomnia: Secondary | ICD-10-CM | POA: Diagnosis present

## 2018-10-25 DIAGNOSIS — Z8249 Family history of ischemic heart disease and other diseases of the circulatory system: Secondary | ICD-10-CM

## 2018-10-25 DIAGNOSIS — I509 Heart failure, unspecified: Secondary | ICD-10-CM

## 2018-10-25 DIAGNOSIS — E119 Type 2 diabetes mellitus without complications: Secondary | ICD-10-CM | POA: Diagnosis present

## 2018-10-25 DIAGNOSIS — Z9884 Bariatric surgery status: Secondary | ICD-10-CM | POA: Diagnosis not present

## 2018-10-25 DIAGNOSIS — M25559 Pain in unspecified hip: Secondary | ICD-10-CM | POA: Diagnosis present

## 2018-10-25 DIAGNOSIS — G609 Hereditary and idiopathic neuropathy, unspecified: Secondary | ICD-10-CM | POA: Diagnosis present

## 2018-10-25 DIAGNOSIS — E042 Nontoxic multinodular goiter: Secondary | ICD-10-CM | POA: Diagnosis present

## 2018-10-25 DIAGNOSIS — I10 Essential (primary) hypertension: Secondary | ICD-10-CM | POA: Diagnosis present

## 2018-10-25 DIAGNOSIS — I5033 Acute on chronic diastolic (congestive) heart failure: Secondary | ICD-10-CM | POA: Diagnosis present

## 2018-10-25 LAB — COMPREHENSIVE METABOLIC PANEL
ALT: 17 U/L (ref 0–44)
AST: 27 U/L (ref 15–41)
Albumin: 4 g/dL (ref 3.5–5.0)
Alkaline Phosphatase: 94 U/L (ref 38–126)
Anion gap: 7 (ref 5–15)
BUN: 13 mg/dL (ref 8–23)
CO2: 28 mmol/L (ref 22–32)
CREATININE: 0.84 mg/dL (ref 0.44–1.00)
Calcium: 9.4 mg/dL (ref 8.9–10.3)
Chloride: 106 mmol/L (ref 98–111)
GFR calc Af Amer: >60 mL/min (ref >60)
Glucose, Bld: 237 mg/dL — ABNORMAL HIGH (ref 70–99)
Potassium: 4.6 mmol/L (ref 3.5–5.1)
Sodium: 141 mmol/L (ref 135–145)
Total Bilirubin: 0.4 mg/dL (ref 0.3–1.2)
Total Protein: 6.8 g/dL (ref 6.5–8.1)

## 2018-10-25 LAB — CBC
HCT: 35.2 % — ABNORMAL LOW (ref 36.0–46.0)
Hemoglobin: 10.4 g/dL — ABNORMAL LOW (ref 12.0–15.0)
MCH: 25.1 pg — ABNORMAL LOW (ref 26.0–34.0)
MCHC: 29.5 g/dL — ABNORMAL LOW (ref 30.0–36.0)
MCV: 84.8 fL (ref 80.0–100.0)
Platelets: 188 10*3/uL (ref 150–400)
RBC: 4.15 MIL/uL (ref 3.87–5.11)
RDW: 14.5 % (ref 11.5–15.5)
WBC: 6.4 10*3/uL (ref 4.0–10.5)
nRBC: 0 % (ref 0.0–0.2)

## 2018-10-25 LAB — GLUCOSE, CAPILLARY: Glucose-Capillary: 243 mg/dL — ABNORMAL HIGH (ref 70–99)

## 2018-10-25 LAB — TROPONIN I: Troponin I: <0.03 ng/mL (ref <0.03)

## 2018-10-25 LAB — BRAIN NATRIURETIC PEPTIDE: B Natriuretic Peptide: 392 pg/mL — ABNORMAL HIGH (ref 0.0–100.0)

## 2018-10-25 MED ORDER — ENOXAPARIN SODIUM 40 MG/0.4ML ~~LOC~~ SOLN
40.0000 mg | SUBCUTANEOUS | Status: DC
  Administered 2018-10-26 – 2018-10-27 (×2): 40 mg via SUBCUTANEOUS
  Filled 2018-10-25 (×2): qty 0.4

## 2018-10-25 MED ORDER — FUROSEMIDE 40 MG PO TABS
40.0000 mg | ORAL_TABLET | Freq: Every day | ORAL | Status: DC
  Administered 2018-10-26: 40 mg via ORAL
  Filled 2018-10-25: qty 1

## 2018-10-25 MED ORDER — PANTOPRAZOLE SODIUM 20 MG PO TBEC
20.0000 mg | DELAYED_RELEASE_TABLET | Freq: Every day | ORAL | Status: DC
  Administered 2018-10-26 – 2018-10-28 (×3): 20 mg via ORAL
  Filled 2018-10-25 (×3): qty 1

## 2018-10-25 MED ORDER — SIMVASTATIN 20 MG PO TABS
40.0000 mg | ORAL_TABLET | Freq: Every day | ORAL | Status: DC
  Administered 2018-10-26 – 2018-10-28 (×3): 40 mg via ORAL
  Filled 2018-10-25 (×3): qty 2

## 2018-10-25 MED ORDER — IPRATROPIUM-ALBUTEROL 0.5-2.5 (3) MG/3ML IN SOLN
9.0000 mL | Freq: Once | RESPIRATORY_TRACT | Status: AC
  Administered 2018-10-25: 9 mL via RESPIRATORY_TRACT

## 2018-10-25 MED ORDER — ONDANSETRON HCL 4 MG/2ML IJ SOLN: 4.0000 mg | Freq: Four times a day (QID) | INTRAMUSCULAR | Status: DC | PRN

## 2018-10-25 MED ORDER — INSULIN ASPART 100 UNIT/ML ~~LOC~~ SOLN
0.0000 [IU] | Freq: Three times a day (TID) | SUBCUTANEOUS | Status: DC
  Administered 2018-10-26: 2 [IU] via SUBCUTANEOUS
  Administered 2018-10-26: 1 [IU] via SUBCUTANEOUS
  Administered 2018-10-26 – 2018-10-27 (×2): 3 [IU] via SUBCUTANEOUS
  Administered 2018-10-27 – 2018-10-28 (×3): 2 [IU] via SUBCUTANEOUS
  Filled 2018-10-25 (×7): qty 1

## 2018-10-25 MED ORDER — INSULIN ASPART 100 UNIT/ML ~~LOC~~ SOLN
0.0000 [IU] | Freq: Every day | SUBCUTANEOUS | Status: DC
  Administered 2018-10-25: 2 [IU] via SUBCUTANEOUS
  Filled 2018-10-25: qty 1

## 2018-10-25 MED ORDER — ACETAMINOPHEN 325 MG PO TABS: 650.0000 mg | ORAL_TABLET | Freq: Four times a day (QID) | ORAL | Status: DC | PRN

## 2018-10-25 MED ORDER — ENALAPRIL MALEATE 5 MG PO TABS
5.0000 mg | ORAL_TABLET | Freq: Every day | ORAL | Status: DC
  Filled 2018-10-25 (×3): qty 1

## 2018-10-25 MED ORDER — ACETAMINOPHEN 650 MG RE SUPP: 650.0000 mg | Freq: Four times a day (QID) | RECTAL | Status: DC | PRN

## 2018-10-25 MED ORDER — LEVOTHYROXINE SODIUM 25 MCG PO TABS
25.0000 ug | ORAL_TABLET | Freq: Every day | ORAL | Status: DC
  Administered 2018-10-26 – 2018-10-28 (×2): 25 ug via ORAL
  Filled 2018-10-25 (×2): qty 1

## 2018-10-25 MED ORDER — ASPIRIN 81 MG PO CHEW
81.0000 mg | CHEWABLE_TABLET | Freq: Every day | ORAL | Status: DC
  Administered 2018-10-26 – 2018-10-28 (×4): 81 mg via ORAL
  Filled 2018-10-25 (×2): qty 1

## 2018-10-25 MED ORDER — METHYLPREDNISOLONE SODIUM SUCC 125 MG IJ SOLR
125.0000 mg | Freq: Once | INTRAMUSCULAR | Status: AC
  Administered 2018-10-25: 125 mg via INTRAVENOUS

## 2018-10-25 MED ORDER — IPRATROPIUM-ALBUTEROL 0.5-2.5 (3) MG/3ML IN SOLN
RESPIRATORY_TRACT | Status: AC
  Filled 2018-10-25: qty 9

## 2018-10-25 MED ORDER — ONDANSETRON HCL 4 MG PO TABS: 4.0000 mg | ORAL_TABLET | Freq: Four times a day (QID) | ORAL | Status: DC | PRN

## 2018-10-25 MED ORDER — FUROSEMIDE 10 MG/ML IJ SOLN
60.0000 mg | Freq: Once | INTRAMUSCULAR | Status: AC
  Administered 2018-10-25: 60 mg via INTRAVENOUS
  Filled 2018-10-25: qty 8

## 2018-10-25 MED ORDER — METHYLPREDNISOLONE SODIUM SUCC 125 MG IJ SOLR
INTRAMUSCULAR | Status: AC
  Filled 2018-10-25: qty 2

## 2018-10-25 NOTE — ED Provider Notes (Signed)
[facility] Emergency Department Provider Note  Time seen: 5:41 PM  I have reviewed the triage vital signs and the nursing notes.   HISTORY  Chief Complaint Shortness of Breath    HPI Grace [name] is a 78 y.o. female with a past medical history of diabetes, hypertension, hyperlipidemia, presents to the emergency department for shortness of breath.  According to the patient and EMS patient has been short of breath for 1 week.  They state initial room air saturation in the 80s however after 1 breathing treatment upon arrival patient is satting 96 to 99% on room air.  No home O2 requirement.  Upon arrival patient states she is feeling much better, denies any chest pain, states she has been feeling short of breath with cough and occasional wheeze x1 week.  Denies any significant sputum production.  Denies any known fever.  No leg pain or significant swelling.   Past Medical History:  Diagnosis Date  . Allergic rhinitis   . B12 deficiency   . Carpal tunnel syndrome   . Chronic hip pain   . Chronic insomnia   . Diabetes mellitus    Type II  . Hyperlipemia   . Hypertension   . Idiopathic neuropathy   . Left thyroid nodule   . Multinodular goiter   . Obesity   . Osteoarthritis   . Osteoarthritis of right knee     Patient Active Problem List   Diagnosis Date Noted  . ECZEMA 07/17/2010  . DYSURIA 07/17/2010  . PERIPHERAL NEUROPATHY 11/30/2009  . MORBID OBESITY 05/28/2008  . CANDIDIASIS, VULVOVAGINAL 01/14/2008  . OSTEOARTHRITIS 10/20/2007  . Type II or unspecified type diabetes mellitus without mention of complication, not stated as uncontrolled 07/28/2007  . HYPERLIPIDEMIA 05/15/2007  . HYPERTENSION 05/15/2007    Past Surgical History:  Procedure Laterality Date  . BARIATRIC SURGERY     10-27-09 per Dr Lucien Mons at [facility]  . CHOLECYSTECTOMY    . COLONOSCOPY  2008   per Dr. Sharlett Iles, repeat 5 yrs   . COLONOSCOPY    . JOINT REPLACEMENT  05    total knee replacement    Prior to Admission medications   Medication Sig Start Date End Date Taking? Authorizing Provider  acetaminophen (TYLENOL) 500 MG tablet Take 500 mg by mouth every other day.     [provider]  amitriptyline (ELAVIL) 25 MG tablet Take 25 mg by mouth at bedtime as needed for sleep.    [provider]  aspirin 81 MG tablet Take 81 mg by mouth daily.      [provider]  Calcium Carbonate (CALCIUM 600 PO) Take by mouth daily. With vitamin D3 250 IU     [provider]  enalapril (VASOTEC) 5 MG tablet TAKE 1 TABLET BY MOUTH EVERY DAY 03/19/13   Laurey Morale, MD  furosemide (LASIX) 40 MG tablet Take 1 tablet (40 mg total) by mouth daily. 11/14/11   Laurey Morale, MD  insulin glargine (LANTUS) 100 UNIT/ML injection Inject 20 Units into the skin at bedtime. 11/14/11   Laurey Morale, MD  levothyroxine (SYNTHROID, LEVOTHROID) 25 MCG tablet Take 25 mcg by mouth daily before breakfast.    [provider]  metFORMIN (GLUCOPHAGE) 500 MG tablet TAKE 2 TABLETS BY MOUTH TWICE DAILY 11/11/12   Laurey Morale, MD  Multiple Vitamin (MULTIVITAMIN) tablet Take 1 tablet by mouth daily. Centrum Silver     [provider]  omeprazole (PRILOSEC) 20 MG  capsule Take 1 capsule (20 mg total) by mouth daily. 11/14/11   Laurey Morale, MD  simvastatin (ZOCOR) 40 MG tablet TAKE ONE TABLET BY MOUTH DAILY 08/18/12   Laurey Morale, MD  triamcinolone cream (KENALOG) 0.5 % Apply topically 2 (two) times daily. 06/10/12   Laurey Morale, MD    No Known Allergies  Family History  Problem Relation Age of Onset  . Arthritis Other   . Diabetes Other   . Hypertension Other   . Heart block Other   . Coronary artery disease Other   . Breast cancer Sister 29    Social History Social History   Tobacco Use  . Smoking status: Never Smoker  . Smokeless tobacco: Former Network engineer Use Topics  . Alcohol use: Yes    Comment: once every couple  of months  . Drug use: No    Review of Systems Constitutional: Negative for fever. Cardiovascular: Negative for chest pain. Respiratory: Positive for shortness of breath with wheeze x1 week.  Positive for cough. Gastrointestinal: Negative for abdominal pain, vomiting Genitourinary: Negative for urinary compaints Musculoskeletal: Negative for musculoskeletal complaints Skin: Negative for skin complaints  Neurological: Negative for headache All other ROS negative  ____________________________________________   PHYSICAL EXAM:  VITAL SIGNS: ED Triage Vitals  Enc Vitals Group     BP 10/25/18 1720 (!) 126/111     Pulse Rate 10/25/18 1720 (!) 119     Resp 10/25/18 1720 (!) 28     Temp 10/25/18 1720 98.2 F (36.8 C)     Temp Source 10/25/18 1720 Oral     SpO2 10/25/18 1720 96 %     Weight 10/25/18 1716 185 lb (83.9 kg)     Height 10/25/18 1716 5\' 3"  (1.6 m)     Head Circumference --      Peak Flow --      Pain Score 10/25/18 1720 0     Pain Loc --      Pain Edu? --      Excl. in [place]? --     Constitutional: Alert and oriented. Well appearing and in no distress. Eyes: Normal exam ENT   Head: Normocephalic and atraumatic.   Mouth/Throat: Mucous membranes are moist. Cardiovascular: Regular rhythm, rate around 110.  No obvious murmur. Respiratory: Patient has mild tachypnea, very slight expiratory wheeze bilaterally, no rales or rhonchi.  Equal breath sounds bilaterally. Gastrointestinal: Soft and nontender. No distention.  Musculoskeletal: Nontender with normal range of motion in all extremities. No lower extremity tenderness Neurologic:  Normal speech and language. No gross focal neurologic deficits  Skin:  Skin is warm, dry and intact.  Psychiatric: Mood and affect are normal.   ____________________________________________    EKG  EKG viewed and interpreted by myself shows sinus tachycardia 118 bpm with a slightly widened QRS, normal axis, largely normal intervals  with nonspecific ST changes.  No ST elevation.  ____________________________________________    RADIOLOGY  X-ray consistent with congestive heart failure and interstitial edema.  ____________________________________________   INITIAL IMPRESSION / ASSESSMENT AND PLAN / ED COURSE  Pertinent labs & imaging results that were available during my care of the patient were reviewed by me and considered in my medical decision making (see chart for details).  She presents to the emergency department for shortness of breath and cough ongoing x1 week but worse tonight.  EMS states audible wheeze upon arrival with sats in the 80s given 1 breathing treatment and arrives with sats in the  mid to upper 90s.  Differential at this time would include reactive airway disease, bronchitis, pneumonia, ACS, pulmonary edema.  We will check labs, chest x-ray, treat with Solu-Medrol and breathing treatments as the patient appeared to have a good response after albuterol treatment by EMS.  Overall the patient appears well no acute distress at this time.  Patient satting around 90% on room air placed on 2 L via nasal cannula.  BNP elevated 392, x-ray consistent with interstitial edema.  Patient will be admitted to the hospital service.  Given IV Lasix.  ____________________________________________   FINAL CLINICAL IMPRESSION(S) / ED DIAGNOSES  Dyspnea CHF exacerbation   Harvest Dark, MD 10/25/18 2013

## 2018-10-25 NOTE — ED Triage Notes (Signed)
Pt presents via EMS s/o SOB x1 week. EMS reports saturation in the 80s upon arrival with tripod breathing. Audible wheezing noted.

## 2018-10-25 NOTE — H&P (Signed)
[place] [facility] [place] NAME: Grace [name]    MR#:  [id number]  DATE OF BIRTH:  [birth year]  DATE OF ADMISSION:  10/25/2018  PRIMARY CARE PHYSICIAN: Laurey Morale, MD   REQUESTING/REFERRING PHYSICIAN: Kerman Passey, MD  CHIEF COMPLAINT:   Chief Complaint  Patient presents with  . Shortness of Breath    HISTORY OF PRESENT ILLNESS:  Grace [name]  is a 78 y.o. female who presents with chief complaint as above.  Patient presents with a complaint of shortness of breath.  She states that this started today when she was walking to her mailbox.  However, on further conversation she mentions that she has been having some orthopnea for the past 3 weeks, and some mild lower extremity edema.  She has no prior diagnosis of heart failure, though imaging today is consistent with some fluid in her lungs.  Hospitalist were called for admission and further evaluation  PAST MEDICAL HISTORY:   Past Medical History:  Diagnosis Date  . Allergic rhinitis   . B12 deficiency   . Carpal tunnel syndrome   . Chronic hip pain   . Chronic insomnia   . Diabetes mellitus    Type II  . Hyperlipemia   . Hypertension   . Idiopathic neuropathy   . Left thyroid nodule   . Multinodular goiter   . Obesity   . Osteoarthritis   . Osteoarthritis of right knee      PAST SURGICAL HISTORY:   Past Surgical History:  Procedure Laterality Date  . BARIATRIC SURGERY     10-27-09 per Dr Lucien Mons at [facility]  . CHOLECYSTECTOMY    . COLONOSCOPY  2008   per Dr. Sharlett Iles, repeat 5 yrs   . COLONOSCOPY    . JOINT REPLACEMENT  05   total knee replacement     SOCIAL HISTORY:   Social History   Tobacco Use  . Smoking status: Never Smoker  . Smokeless tobacco: Former Network engineer Use Topics  . Alcohol use: Yes    Comment: once every couple of months     FAMILY HISTORY:   Family History  Problem Relation Age of Onset  . Arthritis Other   . Diabetes  Other   . Hypertension Other   . Heart block Other   . Coronary artery disease Other   . Breast cancer Sister 83     DRUG ALLERGIES:  No Known Allergies  MEDICATIONS AT HOME:   Prior to Admission medications   Medication Sig Start Date End Date Taking? Authorizing Provider  acetaminophen (TYLENOL) 500 MG tablet Take 500 mg by mouth every other day.     [provider]  amitriptyline (ELAVIL) 25 MG tablet Take 25 mg by mouth at bedtime as needed for sleep.    [provider]  aspirin 81 MG tablet Take 81 mg by mouth daily.      [provider]  Calcium Carbonate (CALCIUM 600 PO) Take by mouth daily. With vitamin D3 250 IU     [provider]  enalapril (VASOTEC) 5 MG tablet TAKE 1 TABLET BY MOUTH EVERY DAY 03/19/13   Laurey Morale, MD  furosemide (LASIX) 40 MG tablet Take 1 tablet (40 mg total) by mouth daily. 11/14/11   Laurey Morale, MD  insulin glargine (LANTUS) 100 UNIT/ML injection Inject 20 Units into the skin at bedtime. 11/14/11   Laurey Morale, MD  levothyroxine (SYNTHROID, LEVOTHROID) 25 MCG tablet Take  25 mcg by mouth daily before breakfast.    [provider]  metFORMIN (GLUCOPHAGE) 500 MG tablet TAKE 2 TABLETS BY MOUTH TWICE DAILY 11/11/12   Laurey Morale, MD  Multiple Vitamin (MULTIVITAMIN) tablet Take 1 tablet by mouth daily. Centrum Silver     [provider]  omeprazole (PRILOSEC) 20 MG capsule Take 1 capsule (20 mg total) by mouth daily. 11/14/11   Laurey Morale, MD  simvastatin (ZOCOR) 40 MG tablet TAKE ONE TABLET BY MOUTH DAILY 08/18/12   Laurey Morale, MD  triamcinolone cream (KENALOG) 0.5 % Apply topically 2 (two) times daily. 06/10/12   Laurey Morale, MD    REVIEW OF SYSTEMS:  Review of Systems  Constitutional: Negative for chills, fever, malaise/fatigue and weight loss.  HENT: Negative for ear pain, hearing loss and tinnitus.   Eyes: Negative for blurred vision, double vision, pain and redness.   Respiratory: Positive for shortness of breath. Negative for cough and hemoptysis.   Cardiovascular: Positive for orthopnea and leg swelling. Negative for chest pain and palpitations.  Gastrointestinal: Negative for abdominal pain, constipation, diarrhea, nausea and vomiting.  Genitourinary: Negative for dysuria, frequency and hematuria.  Musculoskeletal: Negative for back pain, joint pain and neck pain.  Skin:       No acne, rash, or lesions  Neurological: Negative for dizziness, tremors, focal weakness and weakness.  Endo/Heme/Allergies: Negative for polydipsia. Does not bruise/bleed easily.  Psychiatric/Behavioral: Negative for depression. The patient is not nervous/anxious and does not have insomnia.      VITAL SIGNS:   Vitals:   10/25/18 1730 10/25/18 1745 10/25/18 1800 10/25/18 2003  BP: 127/74  112/79 (!) 130/112  Pulse: (!) 116 (!) 113    Resp: (!) 27 (!) 23 (!) 34 16  Temp:      TempSrc:      SpO2: 97% 100%    Weight:      Height:       Wt Readings from Last 3 Encounters:  10/25/18 83.9 kg  09/01/16 81.6 kg  06/10/12 78.9 kg    PHYSICAL EXAMINATION:  Physical Exam  Vitals reviewed. Constitutional: She is oriented to person, place, and time. She appears well-developed and well-nourished. No distress.  HENT:  Head: Normocephalic and atraumatic.  Mouth/Throat: Oropharynx is clear and moist.  Eyes: Pupils are equal, round, and reactive to light. Conjunctivae and EOM are normal. No scleral icterus.  Neck: Normal range of motion. Neck supple. No JVD present. No thyromegaly present.  Cardiovascular: Normal rate, regular rhythm and intact distal pulses. Exam reveals no gallop and no friction rub.  No murmur heard. Respiratory: Effort normal. No respiratory distress. She has no wheezes. She has rales.  GI: Soft. Bowel sounds are normal. She exhibits no distension. There is no abdominal tenderness.  Musculoskeletal: Normal range of motion.        General: Edema present.      Comments: No arthritis, no gout  Lymphadenopathy:    She has no cervical adenopathy.  Neurological: She is alert and oriented to person, place, and time. No cranial nerve deficit.  No dysarthria, no aphasia  Skin: Skin is warm and dry. No rash noted. No erythema.  Psychiatric: She has a normal mood and affect. Her behavior is normal. Judgment and thought content normal.    LABORATORY PANEL:   CBC Recent Labs  Lab 10/25/18 1721  WBC 6.4  HGB 10.4*  HCT 35.2*  PLT 188   ------------------------------------------------------------------------------------------------------------------  Chemistries  Recent Labs  Lab 10/25/18 1721  NA 141  K 4.6  CL 106  CO2 28  GLUCOSE 237*  BUN 13  CREATININE 0.84  CALCIUM 9.4  AST 27  ALT 17  ALKPHOS 94  BILITOT 0.4   ------------------------------------------------------------------------------------------------------------------  Cardiac Enzymes Recent Labs  Lab 10/25/18 1721  TROPONINI <0.03   ------------------------------------------------------------------------------------------------------------------  RADIOLOGY:  Dg Chest 2 View  Result Date: 10/25/2018 CLINICAL DATA:  Worsening shortness of breath today. EXAM: CHEST - 2 VIEW COMPARISON:  09/01/2016 FINDINGS: Mild cardiomegaly again noted. Diffuse interstitial infiltrates are suspicious for interstitial edema. Small bilateral pleural effusions also seen. IMPRESSION: Findings consistent with congestive heart failure with small bilateral pleural effusions. Electronically Signed   By: Earle Gell M.D.   On: 10/25/2018 18:49    EKG:   Orders placed or performed during the hospital encounter of 10/25/18  . ED EKG  . ED EKG    IMPRESSION AND PLAN:  Principal Problem:   Acute systolic CHF (congestive heart failure) ([place]) -patient was given IV Lasix in the ED, we will continue with this therapy, get an echocardiogram and a cardiology consult Active Problems:    Diabetes ([place]) -sliding scale insulin coverage   HTN (hypertension) -home dose antihypertensives   HLD (hyperlipidemia) -Home dose antilipid   Hypothyroidism -home dose thyroid replacement   GERD (gastroesophageal reflux disease) -home dose PPI  Chart review performed and case discussed with ED provider. Labs, imaging and/or ECG reviewed by provider and discussed with patient/family. Management plans discussed with the patient and/or family.  DVT PROPHYLAXIS: SubQ lovenox   GI PROPHYLAXIS:  PPI   ADMISSION STATUS: Inpatient     CODE STATUS: Full  TOTAL TIME TAKING CARE OF THIS PATIENT: 45 minutes.   Ethlyn Daniels 10/25/2018, 9:45 PM  [facility]  Office  [phone]  CC: Primary care physician; Laurey Morale, MD  Note:  This document was prepared using Dragon voice recognition software and may include unintentional dictation errors.

## 2018-10-25 NOTE — ED Notes (Signed)
Called floor to give report to floor, Charge ask if I can call them back in 5 minutes. This RN will call them back soon.

## 2018-10-26 ENCOUNTER — Inpatient Hospital Stay
Admit: 2018-10-26 | Discharge: 2018-10-26 | Disposition: A | Discharge status: Home / Self Care | Payer: Medicare HMO | Attending: Internal Medicine | Admitting: Internal Medicine

## 2018-10-26 LAB — ECHOCARDIOGRAM COMPLETE
Height: 63 in
Weight: 3056 oz

## 2018-10-26 LAB — BASIC METABOLIC PANEL
Anion gap: 10 (ref 5–15)
BUN: 14 mg/dL (ref 8–23)
CHLORIDE: 102 mmol/L (ref 98–111)
CO2: 27 mmol/L (ref 22–32)
CREATININE: 0.93 mg/dL (ref 0.44–1.00)
Calcium: 9.2 mg/dL (ref 8.9–10.3)
GFR calc Af Amer: >60 mL/min (ref >60)
GFR calc non Af Amer: >60 mL/min (ref >60)
Glucose, Bld: 288 mg/dL — ABNORMAL HIGH (ref 70–99)
Potassium: 4.1 mmol/L (ref 3.5–5.1)
Sodium: 139 mmol/L (ref 135–145)

## 2018-10-26 LAB — CBC
HEMATOCRIT: 33.8 % — AB (ref 36.0–46.0)
Hemoglobin: 10 g/dL — ABNORMAL LOW (ref 12.0–15.0)
MCH: 24.8 pg — ABNORMAL LOW (ref 26.0–34.0)
MCHC: 29.6 g/dL — ABNORMAL LOW (ref 30.0–36.0)
MCV: 83.7 fL (ref 80.0–100.0)
Platelets: 180 10*3/uL (ref 150–400)
RBC: 4.04 MIL/uL (ref 3.87–5.11)
RDW: 14.4 % (ref 11.5–15.5)
WBC: 8.3 10*3/uL (ref 4.0–10.5)
nRBC: 0 % (ref 0.0–0.2)

## 2018-10-26 LAB — GLUCOSE, CAPILLARY
Glucose-Capillary: 204 mg/dL — ABNORMAL HIGH (ref 70–99)
Glucose-Capillary: 159 mg/dL — ABNORMAL HIGH (ref 70–99)
Glucose-Capillary: 137 mg/dL — ABNORMAL HIGH (ref 70–99)

## 2018-10-26 MED ORDER — SODIUM CHLORIDE 0.9% FLUSH
3.0000 mL | Freq: Two times a day (BID) | INTRAVENOUS | Status: DC
  Administered 2018-10-26 – 2018-10-27 (×4): 3 mL via INTRAVENOUS

## 2018-10-26 MED ORDER — ASPIRIN 81 MG PO CHEW
81.0000 mg | CHEWABLE_TABLET | ORAL | Status: AC
  Filled 2018-10-26: qty 1

## 2018-10-26 MED ORDER — SODIUM CHLORIDE 0.9% FLUSH
3.0000 mL | Freq: Two times a day (BID) | INTRAVENOUS | Status: DC
  Administered 2018-10-26: 3 mL via INTRAVENOUS

## 2018-10-26 MED ORDER — SODIUM CHLORIDE 0.9 % IV SOLN
INTRAVENOUS | Status: DC
  Administered 2018-10-27: 06:00:00 via INTRAVENOUS

## 2018-10-26 MED ORDER — SODIUM CHLORIDE 0.9% FLUSH: 3.0000 mL | INTRAVENOUS | Status: DC | PRN

## 2018-10-26 NOTE — Progress Notes (Signed)
Spoke with dr. pyreddy regarding Mews score. Per md hold scheduled enalapril, give lasix and continue to monitor

## 2018-10-26 NOTE — Progress Notes (Addendum)
[place] [facility] [place] NAME: Grace [name]    MR#:  [id number]  DATE OF BIRTH:  16-Jun-1941  SUBJECTIVE:  CHIEF COMPLAINT:   Chief Complaint  Patient presents with  . Shortness of Breath  Patient seen and evaluated today Currently on oxygen via nasal cannula Has shortness of breath Swelling in the legs No chest pain  REVIEW OF SYSTEMS:    ROS  CONSTITUTIONAL: No documented fever. No fatigue, weakness. No weight gain, no weight loss.  EYES: No blurry or double vision.  ENT: No tinnitus. No postnasal drip. No redness of the oropharynx.  RESPIRATORY: No cough, no wheeze, no hemoptysis. Has dyspnea.  CARDIOVASCULAR: No chest pain. Has orthopnea. No palpitations. No syncope.  GASTROINTESTINAL: No nausea, no vomiting or diarrhea. No abdominal pain. No melena or hematochezia.  GENITOURINARY: No dysuria or hematuria.  ENDOCRINE: No polyuria or nocturia. No heat or cold intolerance.  HEMATOLOGY: No anemia. No bruising. No bleeding.  INTEGUMENTARY: No rashes. No lesions.  MUSCULOSKELETAL: No arthritis. Has swelling. No gout.  NEUROLOGIC: No numbness, tingling, or ataxia. No seizure-type activity.  PSYCHIATRIC: No anxiety. No insomnia. No ADD.   DRUG ALLERGIES:   Allergies  Allergen Reactions  . Wool Alcohol  [Lanolin] Rash    VITALS:  Blood pressure 97/61, pulse (!) 110, temperature 98.3 F (36.8 C), temperature source Oral, resp. rate 19, height 5\' 3"  (1.6 m), weight 86.6 kg, SpO2 96 %.  PHYSICAL EXAMINATION:   Physical Exam  GENERAL:  78 y.o.-year-old patient lying in the bed with no acute distress.  EYES: Pupils equal, round, reactive to light and accommodation. No scleral icterus. Extraocular muscles intact.  HEENT: Head atraumatic, normocephalic. Oropharynx and nasopharynx clear.  NECK:  Supple, no jugular venous distention. No thyroid enlargement, no tenderness.  LUNGS: Decreased breath sounds bilaterally, bibasilar crepitations  heard. No use of accessory muscles of respiration.  CARDIOVASCULAR: S1, S2 normal. No murmurs, rubs, or gallops.  ABDOMEN: Soft, nontender, nondistended. Bowel sounds present. No organomegaly or mass.  EXTREMITIES: No cyanosis, clubbing  Has  edema  In lower extremities NEUROLOGIC: Cranial nerves II through XII are intact. No focal Motor or sensory deficits b/l.   PSYCHIATRIC: The patient is alert and oriented x 3.  SKIN: No obvious rash, lesion, or ulcer.   LABORATORY PANEL:   CBC Recent Labs  Lab 10/26/18 0306  WBC 8.3  HGB 10.0*  HCT 33.8*  PLT 180   ------------------------------------------------------------------------------------------------------------------ Chemistries  Recent Labs  Lab 10/25/18 1721 10/26/18 0306  NA 141 139  K 4.6 4.1  CL 106 102  CO2 28 27  GLUCOSE 237* 288*  BUN 13 14  CREATININE 0.84 0.93  CALCIUM 9.4 9.2  AST 27  --   ALT 17  --   ALKPHOS 94  --   BILITOT 0.4  --    ------------------------------------------------------------------------------------------------------------------  Cardiac Enzymes Recent Labs  Lab 10/25/18 1721  TROPONINI <0.03   ------------------------------------------------------------------------------------------------------------------  RADIOLOGY:  Dg Chest 2 View  Result Date: 10/25/2018 CLINICAL DATA:  Worsening shortness of breath today. EXAM: CHEST - 2 VIEW COMPARISON:  09/01/2016 FINDINGS: Mild cardiomegaly again noted. Diffuse interstitial infiltrates are suspicious for interstitial edema. Small bilateral pleural effusions also seen. IMPRESSION: Findings consistent with congestive heart failure with small bilateral pleural effusions. Electronically Signed   By: Earle Gell M.D.   On: 10/25/2018 18:49     ASSESSMENT AND PLAN:  78 year old elderly female patient with history of carpal tunnel syndrome, hyperlipidemia, hypertension,  obesity, type 2 diabetes mellitus currently under hospitalist service for  shortness of breath  -Acute systolic heart failure  Continue diuresis with IV Lasix Wean oxygen to room air Check echocardiogram Monitor input output chart and daily body weights Continue ACE inhibitor if blood pressure permits and aspirin  -Hypoxia secondary to heart failure Oxygen via nasal cannula Once diuresed will wean off oxygen  -Type 2 diabetes mellitus Diabetic diet with sliding scale coverage with insulin  -Hyperlipidemia Continue statin medication  -GERD Continue proton pump inhibitor  -Obesity Lifestyle modification counseled  -DVT prophylaxis subcu Lovenox daily  All the records are reviewed and case discussed with Care Management/Social Worker. Management plans discussed with the patient, family and they are in agreement.  CODE STATUS: Full code  DVT Prophylaxis: SCDs  TOTAL TIME TAKING CARE OF THIS PATIENT: 36 minutes.   POSSIBLE D/C IN 1 to 2 DAYS, DEPENDING ON CLINICAL CONDITION.  Saundra Shelling M.D on 10/26/2018 at 10:07 AM  Between 7am to 6pm - Pager - [phone]  After 6pm go to [email or web address] - password [email or web address] [facility]  Office  [phone]  CC: Primary care physician; Laurey Morale, MD  Note: This dictation was prepared with Dragon dictation along with smaller phrase technology. Any transcriptional errors that result from this process are unintentional.

## 2018-10-26 NOTE — Progress Notes (Signed)
Paged dr. Estanislado Pandy to make aware mews score is a 2. Bp 97/61, hr 110. On scheduled enalapril 5mg 

## 2018-10-26 NOTE — Progress Notes (Signed)
*  PRELIMINARY RESULTS* Echocardiogram 2D Echocardiogram has been performed.  Grace [name] Cleon Signorelli 10/26/2018, 11:06 AM

## 2018-10-26 NOTE — Progress Notes (Signed)
Advanced care plan.  Purpose of the Encounter: CODE STATUS  Parties in Attendance: Patient  Patient's Decision Capacity: Good  Subjective/Patient's story: Presented to emergency room for shortness of breath and swelling in the lower legs   Objective/Medical story Patient has acute congestive heart failure Needs IV Lasix for diuresis and echocardiogram and cardiology evaluation   Goals of care determination:  Advance care directives goals of care and treatment plan discussed For now patient wants everything done which includes CPR, intubation ventilator if the need arises   CODE STATUS: Full code   Time spent discussing advanced care planning: 16 minutes

## 2018-10-26 NOTE — Consult Note (Signed)
Reason for Consult: Shortness of breath heart failure Referring Physician: Dr. Alysia Penna primary Dr. Lance Coon hospitalist   Grace [name] is an 78 y.o. female.  HPI: Who presents with complaints of shortness of breath dyspnea suggestive of heart failure.  Denies any previous cardiac history she has had significant lower extremity edema no chest pain.  Patient also had orthopnea PND symptoms been ongoing over the last 3 weeks who presented to cardiology for further assessment  Past Medical History:  Diagnosis Date  . Allergic rhinitis   . B12 deficiency   . Carpal tunnel syndrome   . Chronic hip pain   . Chronic insomnia   . Diabetes mellitus    Type II  . Hyperlipemia   . Hypertension   . Idiopathic neuropathy   . Left thyroid nodule   . Multinodular goiter   . Obesity   . Osteoarthritis   . Osteoarthritis of right knee     Past Surgical History:  Procedure Laterality Date  . BARIATRIC SURGERY     10-27-09 per Dr Lucien Mons at [facility]  . CHOLECYSTECTOMY    . COLONOSCOPY  2008   per Dr. Sharlett Iles, repeat 5 yrs   . COLONOSCOPY    . JOINT REPLACEMENT  05   total knee replacement    Family History  Problem Relation Age of Onset  . Arthritis Other   . Diabetes Other   . Hypertension Other   . Heart block Other   . Coronary artery disease Other   . Breast cancer Sister 78    Social History:  reports that she has never smoked. She has quit using smokeless tobacco. She reports current alcohol use. She reports that she does not use drugs.  Allergies:  Allergies  Allergen Reactions  . Wool Alcohol  [Lanolin] Rash    Medications: I have reviewed the patient's current medications.  Results for orders placed or performed during the hospital encounter of 10/25/18 (from the past 48 hour(s))  CBC     Status: Abnormal   Collection Time: 10/25/18  5:21 PM  Result Value Ref Range   WBC 6.4 4.0 - 10.5 K/uL   RBC 4.15 3.87 - 5.11 MIL/uL   Hemoglobin 10.4 (L) 12.0 -  15.0 g/dL   HCT 35.2 (L) 36.0 - 46.0 %   MCV 84.8 80.0 - 100.0 fL   MCH 25.1 (L) 26.0 - 34.0 pg   MCHC 29.5 (L) 30.0 - 36.0 g/dL   RDW 14.5 11.5 - 15.5 %   Platelets 188 150 - 400 K/uL   nRBC 0.0 0.0 - 0.2 %    Comment: Performed at [facility], [address] [place], [place] [address]  Comprehensive metabolic panel     Status: Abnormal   Collection Time: 10/25/18  5:21 PM  Result Value Ref Range   Sodium 141 135 - 145 mmol/L   Potassium 4.6 3.5 - 5.1 mmol/L   Chloride 106 98 - 111 mmol/L   CO2 28 22 - 32 mmol/L   Glucose, Bld 237 (H) 70 - 99 mg/dL   BUN 13 8 - 23 mg/dL   Creatinine, Ser 0.84 0.44 - 1.00 mg/dL   Calcium 9.4 8.9 - 10.3 mg/dL   Total Protein 6.8 6.5 - 8.1 g/dL   Albumin 4.0 3.5 - 5.0 g/dL   AST 27 15 - 41 U/L   ALT 17 0 - 44 U/L   Alkaline Phosphatase 94 38 - 126 U/L   Total Bilirubin 0.4  0.3 - 1.2 mg/dL   GFR calc non Af Amer >60 >60 mL/min   GFR calc Af Amer >60 >60 mL/min   Anion gap 7 5 - 15    Comment: Performed at [facility], [address] [place], [place] [address]  Troponin I - ONCE - STAT     Status: None   Collection Time: 10/25/18  5:21 PM  Result Value Ref Range   Troponin I <0.03 <0.03 ng/mL    Comment: Performed at [facility], [address] [place], [place] [address]  Brain natriuretic peptide     Status: Abnormal   Collection Time: 10/25/18  5:21 PM  Result Value Ref Range   B Natriuretic Peptide 392.0 (H) 0.0 - 100.0 pg/mL    Comment: Performed at [facility], [address] [place], [place] [address]  Glucose, capillary     Status: Abnormal   Collection Time: 10/25/18 11:07 PM  Result Value Ref Range   Glucose-Capillary 243 (H) 70 - 99 mg/dL   Comment 1 Notify RN    Comment 2 Document in Chart   Basic metabolic panel     Status: Abnormal   Collection Time: 10/26/18  3:06 AM  Result Value Ref Range   Sodium 139 135 - 145 mmol/L   Potassium 4.1 3.5 - 5.1 mmol/L   Chloride 102 98 - 111  mmol/L   CO2 27 22 - 32 mmol/L   Glucose, Bld 288 (H) 70 - 99 mg/dL   BUN 14 8 - 23 mg/dL   Creatinine, Ser 0.93 0.44 - 1.00 mg/dL   Calcium 9.2 8.9 - 10.3 mg/dL   GFR calc non Af Amer >60 >60 mL/min   GFR calc Af Amer >60 >60 mL/min   Anion gap 10 5 - 15    Comment: Performed at [facility], [address] [place], [place] [address]  CBC     Status: Abnormal   Collection Time: 10/26/18  3:06 AM  Result Value Ref Range   WBC 8.3 4.0 - 10.5 K/uL   RBC 4.04 3.87 - 5.11 MIL/uL   Hemoglobin 10.0 (L) 12.0 - 15.0 g/dL   HCT 33.8 (L) 36.0 - 46.0 %   MCV 83.7 80.0 - 100.0 fL   MCH 24.8 (L) 26.0 - 34.0 pg   MCHC 29.6 (L) 30.0 - 36.0 g/dL   RDW 14.4 11.5 - 15.5 %   Platelets 180 150 - 400 K/uL   nRBC 0.0 0.0 - 0.2 %    Comment: Performed at [facility], [address] [place], [place] [address]  Glucose, capillary     Status: Abnormal   Collection Time: 10/26/18  7:55 AM  Result Value Ref Range   Glucose-Capillary 204 (H) 70 - 99 mg/dL  Glucose, capillary     Status: Abnormal   Collection Time: 10/26/18 12:03 PM  Result Value Ref Range   Glucose-Capillary 159 (H) 70 - 99 mg/dL    Dg Chest 2 View  Result Date: 10/25/2018 CLINICAL DATA:  Worsening shortness of breath today. EXAM: CHEST - 2 VIEW COMPARISON:  09/01/2016 FINDINGS: Mild cardiomegaly again noted. Diffuse interstitial infiltrates are suspicious for interstitial edema. Small bilateral pleural effusions also seen. IMPRESSION: Findings consistent with congestive heart failure with small bilateral pleural effusions. Electronically Signed   By: Earle Gell M.D.   On: 10/25/2018 18:49    Review of Systems  Constitutional: Positive for diaphoresis and malaise/fatigue.  HENT: Positive for congestion.   Eyes:  Negative.   Respiratory: Positive for shortness of breath.   Cardiovascular: Positive for orthopnea, leg swelling and PND.  Gastrointestinal: Negative.   Genitourinary: Negative.   Musculoskeletal:  Negative.   Skin: Negative.   Neurological: Negative.   Endo/Heme/Allergies: Negative.   Psychiatric/Behavioral: Negative.    Blood pressure 110/70, pulse (!) 103, temperature 98.3 F (36.8 C), temperature source Oral, resp. rate 19, height 5\' 3"  (1.6 m), weight 86.6 kg, SpO2 96 %. Physical Exam  Nursing note and vitals reviewed. Constitutional: She is oriented to person, place, and time. She appears well-developed and well-nourished.  HENT:  Head: Normocephalic and atraumatic.  Eyes: Pupils are equal, round, and reactive to light. Conjunctivae and EOM are normal.  Neck: Normal range of motion. Neck supple.  Cardiovascular: Normal rate and regular rhythm.  Murmur heard. Respiratory: Effort normal and breath sounds normal.  GI: Soft. Bowel sounds are normal.  Musculoskeletal:        General: Edema present.  Neurological: She is alert and oriented to person, place, and time. She has normal reflexes.  Skin: Skin is warm.  Psychiatric: She has a normal mood and affect.    Assessment/Plan: Acute systolic congestive heart failure Shortness of breath Leg edema Hypertension Hyperlipidemia Diabetes . Plan Agree with admit rule out for myocardial infarction Follow-up cardiac enzymes and EKGs Echocardiogram will be helpful for further assessment for wall motion Consider functional study versus cardiac cath Diuretic therapy Commend hypertension management and control ACE inhibitor or ARB and beta-blocker Continue PPI for GERD symptoms Have the patient follow-up with cardiology as an outpatient   Daxen Lanum D Grover Robinson 10/26/2018, 3:17 PM

## 2018-10-27 ENCOUNTER — Ambulatory Visit: Admission: RE | Admit: 2018-10-27 | Payer: Medicare HMO | Source: Home / Self Care | Admitting: Gastroenterology

## 2018-10-27 ENCOUNTER — Encounter: Admission: RE | Payer: Self-pay | Source: Home / Self Care

## 2018-10-27 ENCOUNTER — Encounter
Admission: EM | Disposition: A | Discharge status: Home Health | Payer: Self-pay | Source: Home / Self Care | Attending: Internal Medicine

## 2018-10-27 HISTORY — DX: Psychophysiologic insomnia: F51.04

## 2018-10-27 HISTORY — DX: Carpal tunnel syndrome, unspecified upper limb: G56.00

## 2018-10-27 HISTORY — DX: Nontoxic multinodular goiter: E04.2

## 2018-10-27 HISTORY — DX: Other chronic pain: G89.29

## 2018-10-27 HISTORY — DX: Nontoxic single thyroid nodule: E04.1

## 2018-10-27 HISTORY — DX: Pain in unspecified hip: M25.559

## 2018-10-27 HISTORY — DX: Unilateral primary osteoarthritis, right knee: M17.11

## 2018-10-27 HISTORY — PX: LEFT HEART CATH AND CORONARY ANGIOGRAPHY: CATH118249

## 2018-10-27 HISTORY — DX: Hereditary and idiopathic neuropathy, unspecified: G60.9

## 2018-10-27 HISTORY — DX: Deficiency of other specified B group vitamins: E53.8

## 2018-10-27 HISTORY — DX: Allergic rhinitis, unspecified: J30.9

## 2018-10-27 LAB — GLUCOSE, CAPILLARY
GLUCOSE-CAPILLARY: 136 mg/dL — AB (ref 70–99)
Glucose-Capillary: 146 mg/dL — ABNORMAL HIGH (ref 70–99)
Glucose-Capillary: 167 mg/dL — ABNORMAL HIGH (ref 70–99)
Glucose-Capillary: 154 mg/dL — ABNORMAL HIGH (ref 70–99)
Glucose-Capillary: 110 mg/dL — ABNORMAL HIGH (ref 70–99)
Glucose-Capillary: 203 mg/dL — ABNORMAL HIGH (ref 70–99)
Glucose-Capillary: 205 mg/dL — ABNORMAL HIGH (ref 70–99)
Glucose-Capillary: 131 mg/dL — ABNORMAL HIGH (ref 70–99)

## 2018-10-27 LAB — BASIC METABOLIC PANEL
Anion gap: 7 (ref 5–15)
BUN: 21 mg/dL (ref 8–23)
CO2: 28 mmol/L (ref 22–32)
Calcium: 9.3 mg/dL (ref 8.9–10.3)
Chloride: 105 mmol/L (ref 98–111)
Creatinine, Ser: 1.04 mg/dL — ABNORMAL HIGH (ref 0.44–1.00)
GFR calc Af Amer: >60 mL/min (ref >60)
GFR calc non Af Amer: 53 mL/min — ABNORMAL LOW (ref >60)
Glucose, Bld: 177 mg/dL — ABNORMAL HIGH (ref 70–99)
Potassium: 3.7 mmol/L (ref 3.5–5.1)
Sodium: 140 mmol/L (ref 135–145)

## 2018-10-27 SURGERY — COLONOSCOPY WITH PROPOFOL
Anesthesia: General

## 2018-10-27 SURGERY — LEFT HEART CATH AND CORONARY ANGIOGRAPHY
Anesthesia: Moderate Sedation

## 2018-10-27 MED ORDER — SODIUM CHLORIDE 0.9 % IV SOLN: 250.0000 mL | INTRAVENOUS | Status: DC | PRN

## 2018-10-27 MED ORDER — POTASSIUM CHLORIDE CRYS ER 20 MEQ PO TBCR
40.0000 meq | EXTENDED_RELEASE_TABLET | Freq: Once | ORAL | Status: AC
  Administered 2018-10-27: 40 meq via ORAL
  Filled 2018-10-27: qty 2

## 2018-10-27 MED ORDER — HEPARIN (PORCINE) IN NACL 1000-0.9 UT/500ML-% IV SOLN
INTRAVENOUS | Status: AC
  Filled 2018-10-27: qty 1000

## 2018-10-27 MED ORDER — SODIUM CHLORIDE 0.9% FLUSH
3.0000 mL | Freq: Two times a day (BID) | INTRAVENOUS | Status: DC
  Administered 2018-10-27: 3 mL via INTRAVENOUS

## 2018-10-27 MED ORDER — ONDANSETRON HCL 4 MG/2ML IJ SOLN: 4.0000 mg | Freq: Four times a day (QID) | INTRAMUSCULAR | Status: DC | PRN

## 2018-10-27 MED ORDER — ACETAMINOPHEN 325 MG PO TABS: 650.0000 mg | ORAL_TABLET | ORAL | Status: DC | PRN

## 2018-10-27 MED ORDER — MIDAZOLAM HCL 2 MG/2ML IJ SOLN
INTRAMUSCULAR | Status: AC
  Filled 2018-10-27: qty 2

## 2018-10-27 MED ORDER — HEPARIN (PORCINE) IN NACL 1000-0.9 UT/500ML-% IV SOLN
INTRAVENOUS | Status: DC | PRN
  Administered 2018-10-27: 500 mL

## 2018-10-27 MED ORDER — VERAPAMIL HCL 2.5 MG/ML IV SOLN
INTRAVENOUS | Status: DC | PRN
  Administered 2018-10-27: 2.5 mg via INTRAVENOUS

## 2018-10-27 MED ORDER — SODIUM CHLORIDE 0.9% FLUSH: 3.0000 mL | INTRAVENOUS | Status: DC | PRN

## 2018-10-27 MED ORDER — HEPARIN SODIUM (PORCINE) 1000 UNIT/ML IJ SOLN
INTRAMUSCULAR | Status: AC
  Filled 2018-10-27: qty 1

## 2018-10-27 MED ORDER — FENTANYL CITRATE (PF) 100 MCG/2ML IJ SOLN
INTRAMUSCULAR | Status: DC | PRN
  Administered 2018-10-27: 25 ug via INTRAVENOUS

## 2018-10-27 MED ORDER — CARVEDILOL 3.125 MG PO TABS: 3.1250 mg | ORAL_TABLET | Freq: Two times a day (BID) | ORAL | Status: DC

## 2018-10-27 MED ORDER — HEPARIN SODIUM (PORCINE) 1000 UNIT/ML IJ SOLN
INTRAMUSCULAR | Status: DC | PRN
  Administered 2018-10-27: 4200 [IU] via INTRAVENOUS

## 2018-10-27 MED ORDER — MIDAZOLAM HCL 2 MG/2ML IJ SOLN
INTRAMUSCULAR | Status: DC | PRN
  Administered 2018-10-27: 0.5 mg via INTRAVENOUS

## 2018-10-27 MED ORDER — FENTANYL CITRATE (PF) 100 MCG/2ML IJ SOLN
INTRAMUSCULAR | Status: AC
  Filled 2018-10-27: qty 2

## 2018-10-27 MED ORDER — IOPAMIDOL (ISOVUE-300) INJECTION 61%
INTRAVENOUS | Status: DC | PRN
  Administered 2018-10-27: 40 mL via INTRA_ARTERIAL

## 2018-10-27 MED ORDER — VERAPAMIL HCL 2.5 MG/ML IV SOLN
INTRAVENOUS | Status: AC
  Filled 2018-10-27: qty 2

## 2018-10-27 SURGICAL SUPPLY — 7 items
CATH INFINITI 5 FR JL3.5 (CATHETERS) ×2 IMPLANT
CATH INFINITI JR4 5F (CATHETERS) ×2 IMPLANT
DEVICE RAD TR BAND REGULAR (VASCULAR PRODUCTS) ×2 IMPLANT
GLIDESHEATH SLEND A-KIT 6F 22G (SHEATH) ×2 IMPLANT
KIT MANI 3VAL PERCEP (MISCELLANEOUS) ×3 IMPLANT
PACK CARDIAC CATH (CUSTOM PROCEDURE TRAY) ×3 IMPLANT
WIRE ROSEN-J .035X260CM (WIRE) ×2 IMPLANT

## 2018-10-27 NOTE — Plan of Care (Signed)
Nutrition Education Note  RD consulted for nutrition education regarding CHF.  78 year old elderly female patient with history of carpal tunnel syndrome, hyperlipidemia, hypertension, obesity, type 2 diabetes mellitus currently under hospitalist service for shortness of breath   Met with pt in room today. Pt reports good appetite and oral intake today and pta. Pt ate 100% of her clear liquid tray this morning. Pt awaiting test today. Pt reports her weight is stable pta.   RD provided "Low Sodium Nutrition Therapy" handout from the Academy of [employer]. Reviewed patient's dietary recall. Provided examples on ways to decrease sodium intake in diet. Discouraged intake of processed foods and use of salt shaker. Encouraged fresh fruits and vegetables as well as whole grain sources of carbohydrates to maximize fiber intake.   RD discussed why it is important for patient to adhere to diet recommendations, and emphasized the role of fluids, foods to avoid, and importance of weighing self daily. Teach back method used.  Expect good compliance.  Body mass index is 33.37 kg/m. Pt meets criteria for obesity based on current BMI.  Current diet order is clear liquid, patient is consuming approximately 100% of meals at this time. Labs and medications reviewed. No further nutrition interventions warranted at this time. RD contact information provided. If additional nutrition issues arise, please re-consult RD.   Koleen Distance MS, RD, LDN Pager #- [phone] Office#- [phone] After Hours Pager: [phone]

## 2018-10-27 NOTE — Progress Notes (Signed)
[place] at [place] NAME: Grace [name]    MR#:  [id number]  DATE OF BIRTH:  1941/07/03  SUBJECTIVE:  CHIEF COMPLAINT:   Chief Complaint  Patient presents with  . Shortness of Breath  Patient seen and evaluated today Weaned off oxygen via nasal cannula Has decreased shortness of breath Swelling in the legs improved No chest pain  REVIEW OF SYSTEMS:    ROS  CONSTITUTIONAL: No documented fever. No fatigue, weakness. No weight gain, no weight loss.  EYES: No blurry or double vision.  ENT: No tinnitus. No postnasal drip. No redness of the oropharynx.  RESPIRATORY: No cough, no wheeze, no hemoptysis. Has no dyspnea.  CARDIOVASCULAR: No chest pain. Has orthopnea. No palpitations. No syncope.  GASTROINTESTINAL: No nausea, no vomiting or diarrhea. No abdominal pain. No melena or hematochezia.  GENITOURINARY: No dysuria or hematuria.  ENDOCRINE: No polyuria or nocturia. No heat or cold intolerance.  HEMATOLOGY: No anemia. No bruising. No bleeding.  INTEGUMENTARY: No rashes. No lesions.  MUSCULOSKELETAL: No arthritis. Has swelling. No gout.  NEUROLOGIC: No numbness, tingling, or ataxia. No seizure-type activity.  PSYCHIATRIC: No anxiety. No insomnia. No ADD.   DRUG ALLERGIES:   Allergies  Allergen Reactions  . Wool Alcohol  [Lanolin] Rash    VITALS:  Blood pressure 123/72, pulse (!) 114, temperature 98 F (36.7 C), temperature source Oral, resp. rate 16, height 5\' 3"  (1.6 m), weight 85.5 kg, SpO2 94 %.  PHYSICAL EXAMINATION:   Physical Exam  GENERAL:  78 y.o.-year-old patient lying in the bed with no acute distress.  EYES: Pupils equal, round, reactive to light and accommodation. No scleral icterus. Extraocular muscles intact.  HEENT: Head atraumatic, normocephalic. Oropharynx and nasopharynx clear.  NECK:  Supple, no jugular venous distention. No thyroid enlargement, no tenderness.  LUNGS: Improved breath sounds bilaterally,  decreased bibasilar crepitations heard. No use of accessory muscles of respiration.  CARDIOVASCULAR: S1, S2 normal. No murmurs, rubs, or gallops.  ABDOMEN: Soft, nontender, nondistended. Bowel sounds present. No organomegaly or mass.  EXTREMITIES: No cyanosis, clubbing  Has  Decreased edema  In lower extremities NEUROLOGIC: Cranial nerves II through XII are intact. No focal Motor or sensory deficits b/l.   PSYCHIATRIC: The patient is alert and oriented x 3.  SKIN: No obvious rash, lesion, or ulcer.   LABORATORY PANEL:   CBC Recent Labs  Lab 10/26/18 0306  WBC 8.3  HGB 10.0*  HCT 33.8*  PLT 180   ------------------------------------------------------------------------------------------------------------------ Chemistries  Recent Labs  Lab 10/25/18 1721  10/27/18 0451  NA 141   < > 140  K 4.6   < > 3.7  CL 106   < > 105  CO2 28   < > 28  GLUCOSE 237*   < > 177*  BUN 13   < > 21  CREATININE 0.84   < > 1.04*  CALCIUM 9.4   < > 9.3  AST 27  --   --   ALT 17  --   --   ALKPHOS 94  --   --   BILITOT 0.4  --   --    < > = values in this interval not displayed.   ------------------------------------------------------------------------------------------------------------------  Cardiac Enzymes Recent Labs  Lab 10/25/18 1721  TROPONINI <0.03   ------------------------------------------------------------------------------------------------------------------  RADIOLOGY:  Dg Chest 2 View  Result Date: 10/25/2018 CLINICAL DATA:  Worsening shortness of breath today. EXAM: CHEST - 2 VIEW COMPARISON:  09/01/2016 FINDINGS: Mild cardiomegaly again  noted. Diffuse interstitial infiltrates are suspicious for interstitial edema. Small bilateral pleural effusions also seen. IMPRESSION: Findings consistent with congestive heart failure with small bilateral pleural effusions. Electronically Signed   By: Earle Gell M.D.   On: 10/25/2018 18:49     ASSESSMENT AND PLAN:  78 year old  elderly female patient with history of carpal tunnel syndrome, hyperlipidemia, hypertension, obesity, type 2 diabetes mellitus currently under hospitalist service for shortness of breath  -Acute systolic heart failure improving Switch to oral Lasix for diuresis status post cardiology evaluation Ischemic work-up with cardiac cath today Monitor input output chart and daily body weights Continue ACE inhibitor and aspirin Start patient on oral beta-blocker  -Hypoxia secondary to heart failure improved Weaned off oxygen  -Type 2 diabetes mellitus Diabetic diet with sliding scale coverage with insulin  -Hyperlipidemia Continue statin medication  -GERD Continue proton pump inhibitor  -Obesity Lifestyle modification counseled  -DVT prophylaxis subcu Lovenox daily  All the records are reviewed and case discussed with Care Management/Social Worker. Management plans discussed with the patient, family and they are in agreement.  CODE STATUS: Full code  DVT Prophylaxis: SCDs  TOTAL TIME TAKING CARE OF THIS PATIENT: 34 minutes.   POSSIBLE D/C IN 1 to 2 DAYS, DEPENDING ON CLINICAL CONDITION.  Saundra Shelling M.D on 10/27/2018 at 10:51 AM  Between 7am to 6pm - Pager - [phone]  After 6pm go to [email or web address] - password [email or web address] [facility]  Office  [phone]  CC: Primary care physician; Laurey Morale, MD  Note: This dictation was prepared with Dragon dictation along with smaller phrase technology. Any transcriptional errors that result from this process are unintentional.

## 2018-10-28 ENCOUNTER — Encounter: Payer: Self-pay | Admitting: Internal Medicine

## 2018-10-28 LAB — BASIC METABOLIC PANEL
ANION GAP: 5 (ref 5–15)
BUN: 21 mg/dL (ref 8–23)
CO2: 26 mmol/L (ref 22–32)
Calcium: 9.1 mg/dL (ref 8.9–10.3)
Chloride: 108 mmol/L (ref 98–111)
Creatinine, Ser: 0.86 mg/dL (ref 0.44–1.00)
GFR calc Af Amer: >60 mL/min (ref >60)
GFR calc non Af Amer: >60 mL/min (ref >60)
Glucose, Bld: 150 mg/dL — ABNORMAL HIGH (ref 70–99)
Potassium: 4.1 mmol/L (ref 3.5–5.1)
Sodium: 139 mmol/L (ref 135–145)

## 2018-10-28 LAB — GLUCOSE, CAPILLARY: Glucose-Capillary: 154 mg/dL — ABNORMAL HIGH (ref 70–99)

## 2018-10-28 MED ORDER — CARVEDILOL 3.125 MG PO TABS: 3.1250 mg | ORAL_TABLET | Freq: Two times a day (BID) | ORAL | 0 refills | Status: DC

## 2018-10-28 MED ORDER — ENALAPRIL MALEATE 5 MG PO TABS: 5.0000 mg | ORAL_TABLET | Freq: Every day | ORAL | 1 refills | Status: DC

## 2018-10-28 NOTE — Plan of Care (Signed)
  Problem: Cardiovascular: Goal: Vascular access site(s) Level 0-1 will be maintained Outcome: Progressing   

## 2018-10-28 NOTE — Care Management Note (Addendum)
Case Management Note  Patient Details  Name: LYNSAY FESPERMAN MRN: [id number] Date of Birth: Sep 08, 1941  Subjective/Objective:    Patient is from home alone.  She was admitted with acute on chronic CHF; no home oxygen.  She drives rarely.  Uses [facility] in [place].  Current with PCP.  She states she does not have a scale at home; states it would be difficult for her to obtain one due to finances.  Provided her with a scale for DC.  Her son will transport her home.  Offered [place] services.  Provided patient with the list of [facility] agencies and ratings.  She would like [facility].  Referral made to [facility] and accepted for RN, PT, SW.  Discharging to home today.  No further needs identified at this time.       Action/Plan:   Expected Discharge Date:  10/28/18               Expected Discharge Plan:  [place]  In-House Referral:     Discharge planning Services  CM Consult  Post Acute Care Choice:    Choice offered to:     DME Arranged:    DME Agency:     HH Arranged:  RN, OT, PT, Nurse's Aide [place] Agency:  [facility] Status of Service:  Completed, signed off  If discussed at [place] of Stay Meetings, dates discussed:    Additional Comments:  Elza Rafter, RN 10/28/2018, 12:18 PM

## 2018-10-28 NOTE — Care Management Important Message (Signed)
Copy of signed Medicare IM left with patient in room. 

## 2018-10-28 NOTE — Progress Notes (Signed)
IVs and tele removed from patient. Discharge instructions given to patient. Verbalized understanding. No acute distress at this time. Son is at bedside and will transport patient home.

## 2018-10-28 NOTE — Discharge Summary (Signed)
[place] at [place] NAME: Grace [name]    MR#:  [id number]  DATE OF BIRTH:  11/25/1940  DATE OF ADMISSION:  10/25/2018 ADMITTING PHYSICIAN: Lance Coon, MD  DATE OF DISCHARGE: 10/28/2018  1:02 PM  PRIMARY CARE PHYSICIAN: Laurey Morale, MD   ADMISSION DIAGNOSIS:  Dyspnea, unspecified type [R06.00] Congestive heart failure, unspecified HF chronicity, unspecified heart failure type ([place]) [I50.9]  DISCHARGE DIAGNOSIS:  Acute on chronic systolic heart failure exacerbation Hypertension Hyperlipidemia Type 2 diabetes mellitus  SECONDARY DIAGNOSIS:   Past Medical History:  Diagnosis Date  . Allergic rhinitis   . B12 deficiency   . Carpal tunnel syndrome   . Chronic hip pain   . Chronic insomnia   . Diabetes mellitus    Type II  . Hyperlipemia   . Hypertension   . Idiopathic neuropathy   . Left thyroid nodule   . Multinodular goiter   . Obesity   . Osteoarthritis   . Osteoarthritis of right knee      ADMITTING HISTORY Grace [name]  is a 78 y.o. female who presents with chief complaint as above.  Patient presents with a complaint of shortness of breath.  She states that this started today when she was walking to her mailbox.  However, on further conversation she mentions that she has been having some orthopnea for the past 3 weeks, and some mild lower extremity edema.  She has no prior diagnosis of heart failure, though imaging today is consistent with some fluid in her lungs.  Hospitalist were called for admission and further evaluation   HOSPITAL COURSE:  Patient was admitted to telemetry.  He was diuresed with IV Lasix for heart failure exacerbation and fluid overload.  She was worked up with an echocardiogram which showed severely reduced systolic function.  Cardiology consultation was done.  Potassium was replaced.  Patient was worked up with cardiac cath which showed overall left ventricular function ejection fraction less  than 25%.  Nonischemic severe cardiomyopathy.  Cardiology recommended to continue heart failure therapy.  Normal coronaries.  Patient will be discharged home on diuretics, ACE inhibitor, beta-blocker and aspirin along with statin.  CONSULTS OBTAINED:  Treatment Team:  Buford Dresser, MD  DRUG ALLERGIES:   Allergies  Allergen Reactions  . Wool Alcohol  [Lanolin] Rash    DISCHARGE MEDICATIONS:   Allergies as of 10/28/2018      Reactions   Wool Alcohol  [lanolin] Rash      Medication List    TAKE these medications   amitriptyline 25 MG tablet Commonly known as:  ELAVIL Take 25 mg by mouth at bedtime.   aspirin 81 MG tablet Take 81 mg by mouth daily.   carvedilol 3.125 MG tablet Commonly known as:  COREG Take 1 tablet (3.125 mg total) by mouth 2 (two) times daily with a meal for 30 days.   enalapril 5 MG tablet Commonly known as:  VASOTEC Take 1 tablet (5 mg total) by mouth daily for 30 days.   furosemide 40 MG tablet Commonly known as:  LASIX Take 1 tablet (40 mg total) by mouth daily.   insulin glargine 100 UNIT/ML injection Commonly known as:  LANTUS Inject 10 Units into the skin 2 (two) times daily.   levothyroxine 25 MCG tablet Commonly known as:  SYNTHROID, LEVOTHROID Take 25 mcg by mouth daily before breakfast.   LINZESS 290 MCG Caps capsule Generic drug:  linaclotide Take 290 mcg by mouth daily before breakfast.  metFORMIN 500 MG tablet Commonly known as:  GLUCOPHAGE TAKE 2 TABLETS BY MOUTH TWICE DAILY What changed:  when to take this   simvastatin 40 MG tablet Commonly known as:  ZOCOR TAKE ONE TABLET BY MOUTH DAILY   vitamin B-12 1000 MCG tablet Commonly known as:  CYANOCOBALAMIN Take 1,000 mcg by mouth daily.       Today  Patient seen today No shortness of breath No chest pain Tolerating diet well Hemodynamically stable VITAL SIGNS:  Blood pressure 104/72, pulse 99, temperature 97.9 F (36.6 C), temperature source Oral,  resp. rate 20, height 5\' 3"  (1.6 m), weight 86 kg, SpO2 94 %.  I/O:    Intake/Output Summary (Last 24 hours) at 10/28/2018 1319 Last data filed at 10/28/2018 1023 Gross per 24 hour  Intake 480 ml  Output 450 ml  Net 30 ml    PHYSICAL EXAMINATION:  Physical Exam  GENERAL:  78 y.o.-year-old patient lying in the bed with no acute distress.  LUNGS: Normal breath sounds bilaterally, no wheezing, rales,rhonchi or crepitation. No use of accessory muscles of respiration.  CARDIOVASCULAR: S1, S2 normal. No murmurs, rubs, or gallops.  ABDOMEN: Soft, non-tender, non-distended. Bowel sounds present. No organomegaly or mass.  NEUROLOGIC: Moves all 4 extremities. PSYCHIATRIC: The patient is alert and oriented x 3.  SKIN: No obvious rash, lesion, or ulcer.   DATA REVIEW:   CBC Recent Labs  Lab 10/26/18 0306  WBC 8.3  HGB 10.0*  HCT 33.8*  PLT 180    Chemistries  Recent Labs  Lab 10/25/18 1721  10/28/18 0451  NA 141   < > 139  K 4.6   < > 4.1  CL 106   < > 108  CO2 28   < > 26  GLUCOSE 237*   < > 150*  BUN 13   < > 21  CREATININE 0.84   < > 0.86  CALCIUM 9.4   < > 9.1  AST 27  --   --   ALT 17  --   --   ALKPHOS 94  --   --   BILITOT 0.4  --   --    < > = values in this interval not displayed.    Cardiac Enzymes Recent Labs  Lab 10/25/18 1721  TROPONINI <0.03    Microbiology Results  No results found for this or any previous visit.  RADIOLOGY:  No results found.  Follow up with PCP in 1 week.  Management plans discussed with the patient, family and they are in agreement.  CODE STATUS: Full code    Code Status Orders  (From admission, onward)         Start     Ordered   10/25/18 2303  Full code  Continuous     10/25/18 2302        Code Status History    This patient has a current code status but no historical code status.      TOTAL TIME TAKING CARE OF THIS PATIENT ON DAY OF DISCHARGE: more than 35 minutes.   Saundra Shelling M.D on 10/28/2018 at  1:19 PM  Between 7am to 6pm - Pager - [phone]  After 6pm go to [email or web address] - password [email or web address] [place] [place] [facility]  Office  [phone]  CC: Primary care physician; Laurey Morale, MD  Note: This dictation was prepared with Dragon dictation along with smaller phrase technology. Any transcriptional errors that result from this process are unintentional.

## 2018-10-28 NOTE — Progress Notes (Signed)
Patient was counseled on her medications and importance of taking them everyday. I gave the patient a heart failure education folder and we went through the information. I stressed the importance of weighting herself daily and to limit salt intake. I explained the MOA and side effects of the medications to the patient.   Thank you for involving me in this patient's care,   Eleonore Chiquito, PharmD, BCPS Clinical Pharmacist  10/28/2018

## 2018-11-01 NOTE — Progress Notes (Deleted)
   Patient ID: Grace [name], female    DOB: October 05, 1940, 78 y.o.   MRN: [id number]  HPI  Grace [name] is a 78 y/o female with a history of  Echo report from 10/26/2018 reviewed and showed an EF of 20-25%.  Admitted 10/25/2018 due to HF exacerbation. Cardiology consult obtained. Initially needed IV lasix and then transitioned to oral diuretics. Medications adjusted and discharged after 3 days.   She presents today for her initial visit with a chief complaint of   Review of Systems    Physical Exam   Assessment & Plan:  1: Chronic heart failure with reduced ejection fraction- - NYHA class - BNP 10/25/2018 was 392.0  2: HTN- - BP - BMP 10/28/2018 reviewed and showed sodium 139, potassium 4.1, creatinine 0.86 and GFR >60  3: DM-  - A1c 07/18/18 was 8.2%

## 2018-11-03 ENCOUNTER — Ambulatory Visit: Payer: PRIVATE HEALTH INSURANCE | Admitting: Family

## 2018-11-03 ENCOUNTER — Telehealth: Payer: Self-pay | Admitting: Family

## 2018-11-03 NOTE — Telephone Encounter (Signed)
Patient did not show for his [facility] Failure Clinic appointment on 11/03/2018. Will attempt to reschedule.

## 2018-11-04 ENCOUNTER — Inpatient Hospital Stay: Payer: PRIVATE HEALTH INSURANCE | Admitting: Family Medicine

## 2018-11-18 ENCOUNTER — Ambulatory Visit: Payer: Self-pay | Admitting: *Deleted

## 2018-11-18 NOTE — Telephone Encounter (Signed)
Patient home nurse is calling to report a low BP- patient is asymptomatic and patient is feeling well and pulse is normal. Call to patient to see if we can get her to the office- she states she does not come to the [place] office anymore. Abby notified of patient response.  Reason for Disposition . [5] Systolic BP 53-748 AND [2] taking blood pressure medications AND [3] NOT dizzy, lightheaded or weak    Call to patient- she states she does not come to the [place] office anymore- she sees Dr Ezequiel Kayser at [facility] in [place]. Call back to Abby to let her know- [phone]. Note sent for FYI  Answer Assessment - Initial Assessment Questions 1. BLOOD PRESSURE: "What is the blood pressure?" "Did you take at least two measurements 5 minutes apart?"     90/52, patient moved around 94/62, 86/52 2. ONSET: "When did you take your blood pressure?"     12:09 3. HOW: "How did you obtain the blood pressure?" (e.g., visiting nurse, automatic home BP monitor)     manually 4. HISTORY: "Do you have a history of low blood pressure?" "What is your blood pressure normally?"     History of hypotension 5. MEDICATIONS: "Are you taking any medications for blood pressure?" If yes: "Have they been changed recently?"     Yes- not since 10/30/18 6. PULSE RATE: "Do you know what your pulse rate is?"      P 76 7. OTHER SYMPTOMS: "Have you been sick recently?" "Have you had a recent injury?"     CHF/diabetic education- glucose 146 8. PREGNANCY: "Is there any chance you are pregnant?" "When was your last menstrual period?"     n/a  Protocols used: LOW BLOOD PRESSURE-A-AH

## 2018-11-18 NOTE — Telephone Encounter (Signed)
Pt is not a [facility] pt.

## 2018-11-26 ENCOUNTER — Other Ambulatory Visit: Payer: Self-pay | Admitting: Internal Medicine

## 2018-11-26 DIAGNOSIS — Z1231 Encounter for screening mammogram for malignant neoplasm of breast: Secondary | ICD-10-CM

## 2019-03-19 ENCOUNTER — Other Ambulatory Visit: Payer: Self-pay

## 2019-03-19 ENCOUNTER — Ambulatory Visit
Admission: RE | Admit: 2019-03-19 | Discharge: 2019-03-19 | Disposition: A | Discharge status: Home / Self Care | Payer: Medicare Other | Source: Ambulatory Visit | Attending: Internal Medicine | Admitting: Internal Medicine

## 2019-03-19 DIAGNOSIS — Z1231 Encounter for screening mammogram for malignant neoplasm of breast: Secondary | ICD-10-CM | POA: Insufficient documentation

## 2019-12-15 ENCOUNTER — Other Ambulatory Visit: Payer: Self-pay | Admitting: Internal Medicine

## 2019-12-15 DIAGNOSIS — Z1231 Encounter for screening mammogram for malignant neoplasm of breast: Secondary | ICD-10-CM

## 2020-03-08 IMAGING — CR DG CHEST 2V
2 series · 2 of 2 positions shown · non-contrast
Comparison: 09/01/2016

CLINICAL DATA: Worsening shortness of breath today.

EXAM:
CHEST - 2 VIEW

[chest lat]
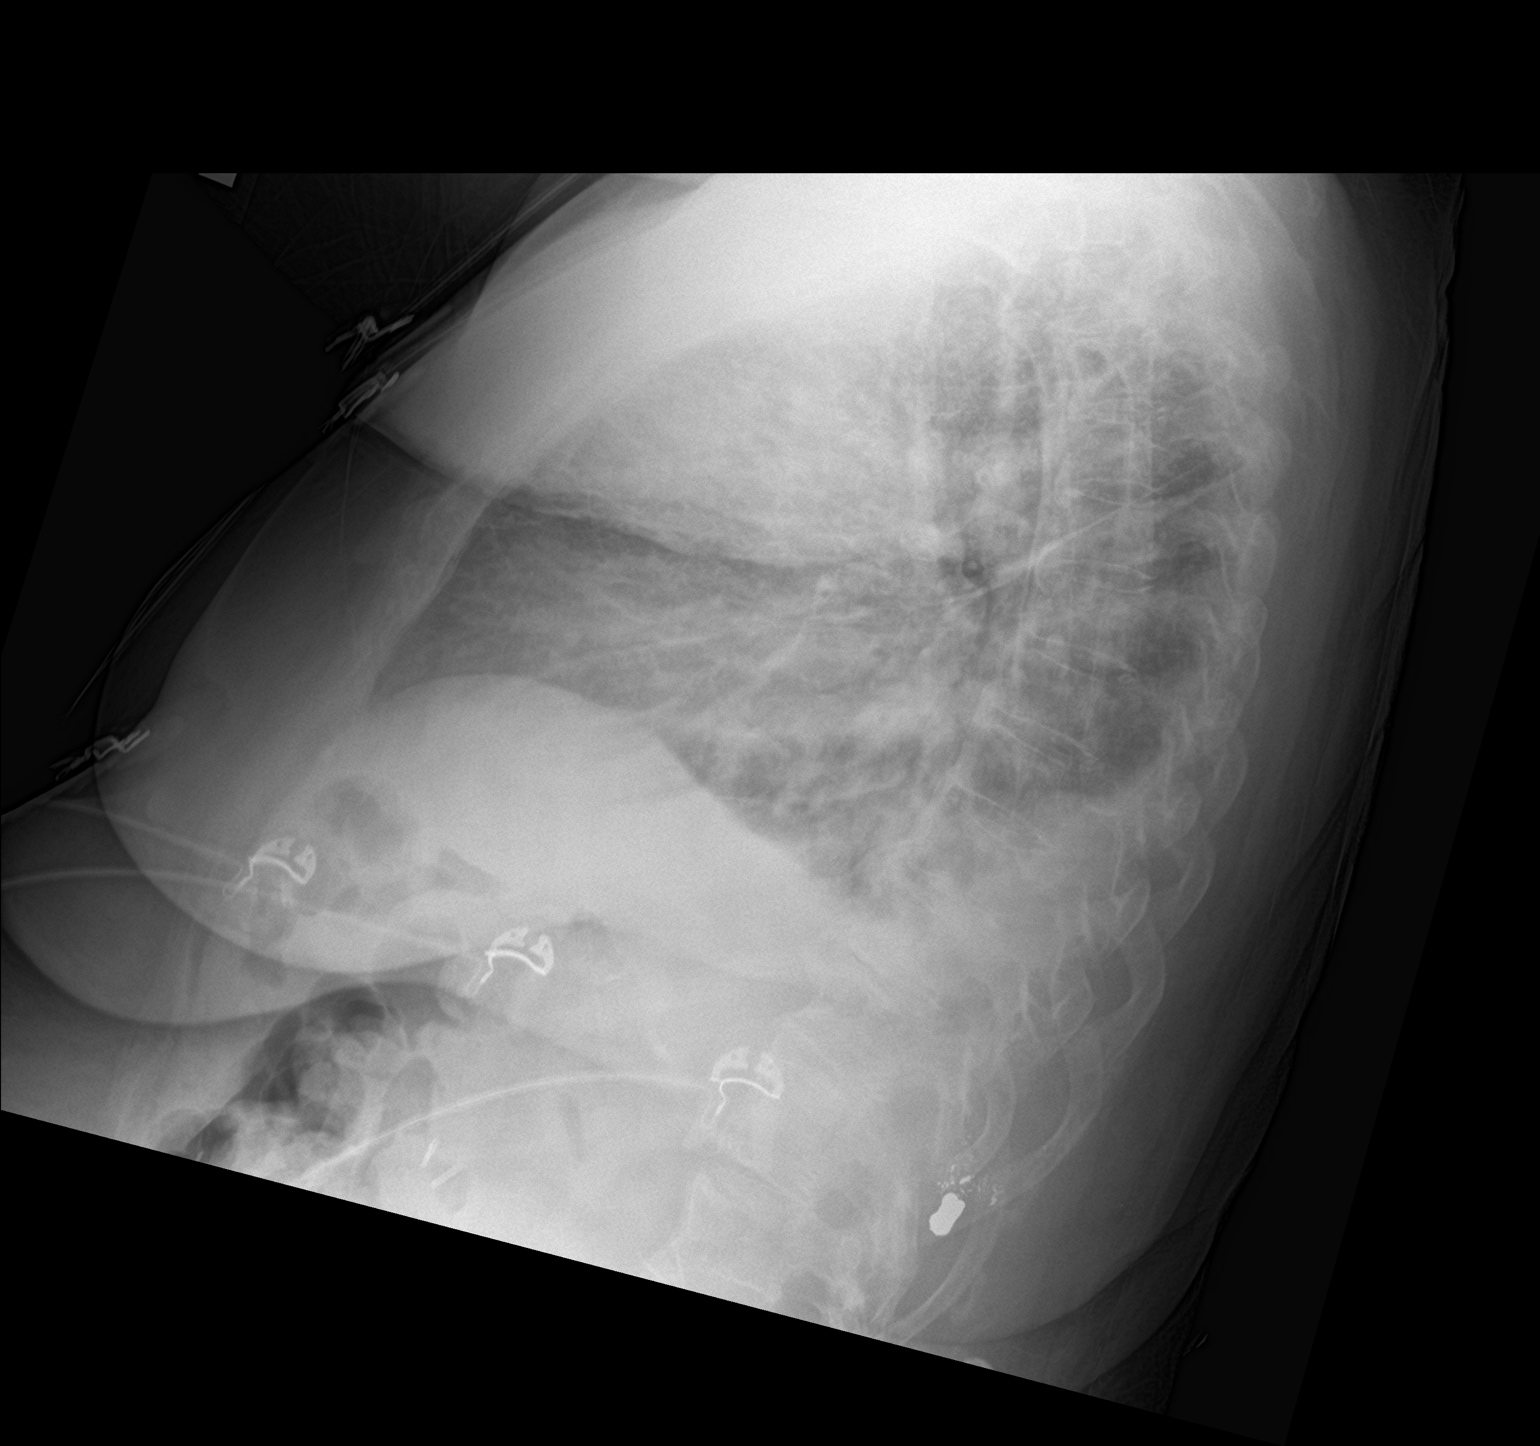

[chest ap]
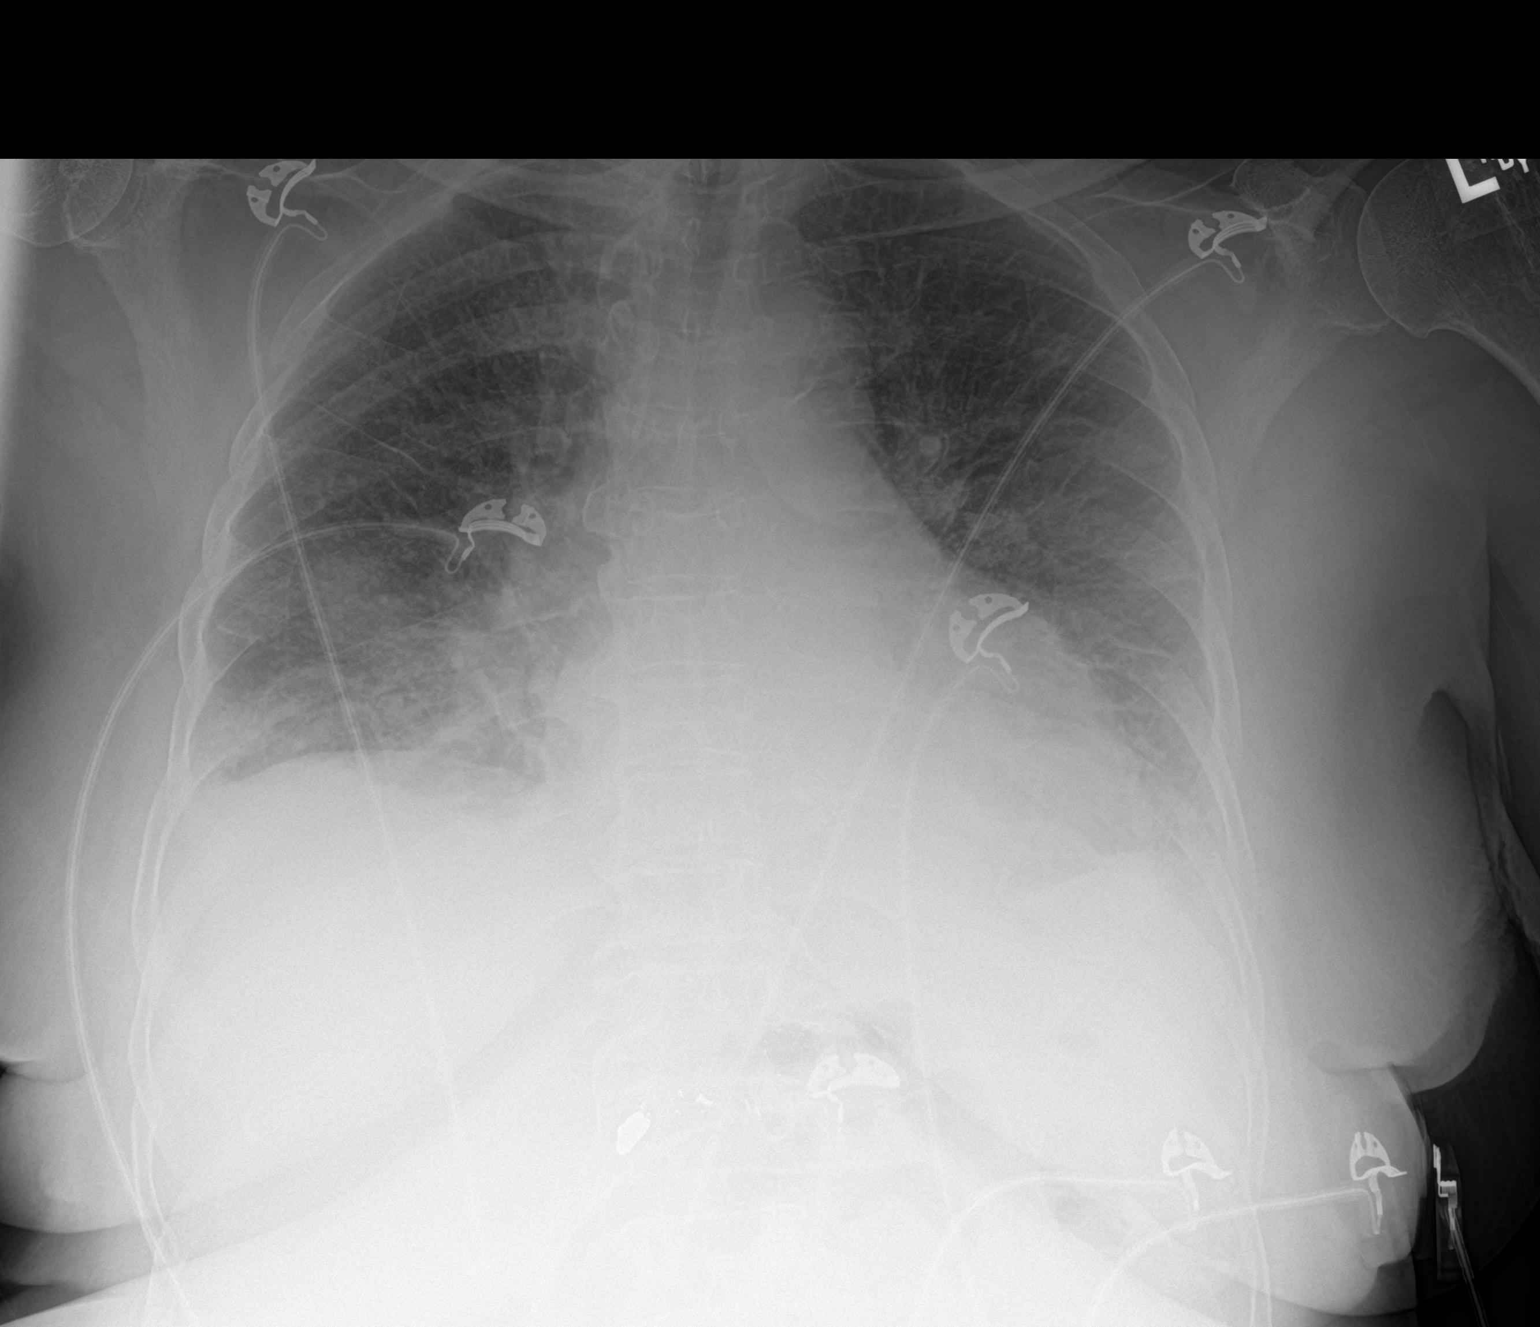

[2 of 2 positions shown; findings below may reference images not displayed]

FINDINGS: Mild cardiomegaly again noted. Diffuse interstitial infiltrates are
suspicious for interstitial edema. Small bilateral pleural effusions
also seen.
IMPRESSION: Findings consistent with congestive heart failure with small
bilateral pleural effusions.

## 2020-03-22 ENCOUNTER — Ambulatory Visit
Admission: RE | Admit: 2020-03-22 | Discharge: 2020-03-22 | Disposition: A | Discharge status: Home / Self Care | Payer: Medicare Other | Source: Ambulatory Visit | Attending: Internal Medicine | Admitting: Internal Medicine

## 2020-03-22 DIAGNOSIS — Z1231 Encounter for screening mammogram for malignant neoplasm of breast: Secondary | ICD-10-CM | POA: Diagnosis present

## 2020-06-03 ENCOUNTER — Ambulatory Visit: Payer: Self-pay

## 2020-06-13 ENCOUNTER — Ambulatory Visit: Payer: Self-pay

## 2020-11-16 ENCOUNTER — Other Ambulatory Visit: Payer: Self-pay | Admitting: Internal Medicine

## 2020-11-16 DIAGNOSIS — R9389 Abnormal findings on diagnostic imaging of other specified body structures: Secondary | ICD-10-CM

## 2020-11-25 ENCOUNTER — Other Ambulatory Visit: Payer: Self-pay

## 2020-11-25 ENCOUNTER — Ambulatory Visit
Admission: RE | Admit: 2020-11-25 | Discharge: 2020-11-25 | Disposition: A | Discharge status: Home / Self Care | Payer: Medicare Other | Source: Ambulatory Visit | Attending: Internal Medicine | Admitting: Internal Medicine

## 2020-11-25 DIAGNOSIS — R9389 Abnormal findings on diagnostic imaging of other specified body structures: Secondary | ICD-10-CM | POA: Insufficient documentation

## 2021-01-12 ENCOUNTER — Other Ambulatory Visit: Payer: Self-pay | Admitting: Internal Medicine

## 2021-01-12 DIAGNOSIS — Z1231 Encounter for screening mammogram for malignant neoplasm of breast: Secondary | ICD-10-CM

## 2021-04-11 ENCOUNTER — Ambulatory Visit: Payer: Self-pay | Admitting: *Deleted

## 2021-04-11 NOTE — Telephone Encounter (Signed)
Reason for Disposition . [1] Weakness or numbness in hand or fingers AND [2] present > 2 weeks  Answer Assessment - Initial Assessment Questions 1. ONSET: "When did the pain start?"     1 year ago  2. LOCATION: "Where is the pain located?"     Right hand  3. PAIN: "How bad is the pain?" (Scale 1-10; or mild, moderate, severe)   - MILD (1-3): doesn't interfere with normal activities   - MODERATE (4-7): interferes with normal activities (e.g., work or school) or awakens from sleep   - SEVERE (8-10): excruciating pain, unable to use hand at all     Moderate , keeps awake at night  4. WORK OR EXERCISE: "Has there been any recent work or exercise that involved this part (i.e., hand or wrist) of the body?"     Na  5. CAUSE: "What do you think is causing the pain?"     Not sure , Carpel tunnel  6. AGGRAVATING FACTORS: "What makes the pain worse?" (e.g., using computer)     Hurts worse at night when she is still 7. OTHER SYMPTOMS: "Do you have any other symptoms?" (e.g., neck pain, swelling, rash, numbness, fever)      Numbness in hand and sometimes leg 8. PREGNANCY: "Is there any chance you are pregnant?" "When was your last menstrual period?"     na  Protocols used: Hand and Wrist Pain-A-AH

## 2021-04-11 NOTE — Telephone Encounter (Signed)
Pts requested new patient packet be sent to her.  KP

## 2021-04-11 NOTE — Telephone Encounter (Signed)
Called patient to review symptoms. Patient reports pain in right hand x 1 year. Reports carpel tunnel. Reports pain in right hand worse at night and she is trying to take tylenol and keep heating pad on right hand. C/o numbness at times and in right leg. Denies numbness in right hand and leg now. Reports she can still do daily activities. Instructed patient to go to UC or ED if pain or numbness occur again. Appt scheduled for 05/25/21 as new patient. Patient requesting new patient information packet to be sent to her at home so she can have time to complete prior to appt. Care advise given. Patient verbalized understanding of care advise and to call back or go to [facility] or ED if symptoms worsen. Patient reports she will go to ED prior to appt 05/25/21 if pain returns.

## 2021-04-11 NOTE — Telephone Encounter (Signed)
[phone]  Pt called requesting to speak to a nurse regarding her hands, her right hand is really hurting. She suffers from [place], she declined offer for new patient appt.   Called patient to review symptoms. No answer, unable to leave message.

## 2021-04-13 ENCOUNTER — Ambulatory Visit: Payer: Self-pay | Admitting: *Deleted

## 2021-04-13 NOTE — Telephone Encounter (Signed)
Called pt could not leave Vm. VM was not set up. Need to know if pt will be seeing [place] for her new patient appointment or [facility]?  [facility] nurse may give results to patient if they return call to clinic, a CRM has been created.   KP

## 2021-04-13 NOTE — Telephone Encounter (Signed)
Answer Assessment - Initial Assessment Questions 1. REASON FOR CALL or QUESTION: "What is your reason for calling today?" or "How can I best help you?" or "What question do you have that I can help answer?"     Millie, from [facility], calling requesting information for a new patient status from MD.  Protocols used: Information Only Call - No Triage-A-AH

## 2021-04-13 NOTE — Telephone Encounter (Signed)
Call received from [facility]- from [facility] to verify medical history for MD for new patient appt for today at 2:40 pm. Unable to answer questions of patient's history . Caller recommended to allow patient to answer questions when she arrives for scheduled appt. No scheduled appt noted in patient's chart for [place]. Caller verbalized understanding .

## 2021-04-14 ENCOUNTER — Other Ambulatory Visit: Payer: Self-pay | Admitting: Family Medicine

## 2021-04-14 DIAGNOSIS — Z1231 Encounter for screening mammogram for malignant neoplasm of breast: Secondary | ICD-10-CM

## 2021-04-25 ENCOUNTER — Other Ambulatory Visit: Payer: Self-pay

## 2021-04-25 ENCOUNTER — Ambulatory Visit
Admission: RE | Admit: 2021-04-25 | Discharge: 2021-04-25 | Disposition: A | Discharge status: Home / Self Care | Payer: Medicare Other | Source: Ambulatory Visit | Attending: Family Medicine | Admitting: Family Medicine

## 2021-04-25 DIAGNOSIS — Z1231 Encounter for screening mammogram for malignant neoplasm of breast: Secondary | ICD-10-CM | POA: Insufficient documentation

## 2021-05-15 DIAGNOSIS — G5600 Carpal tunnel syndrome, unspecified upper limb: Secondary | ICD-10-CM | POA: Diagnosis not present

## 2021-05-15 DIAGNOSIS — Z1389 Encounter for screening for other disorder: Secondary | ICD-10-CM | POA: Diagnosis not present

## 2021-05-15 DIAGNOSIS — E119 Type 2 diabetes mellitus without complications: Secondary | ICD-10-CM | POA: Diagnosis not present

## 2021-05-15 DIAGNOSIS — E042 Nontoxic multinodular goiter: Secondary | ICD-10-CM | POA: Diagnosis not present

## 2021-05-15 DIAGNOSIS — I509 Heart failure, unspecified: Secondary | ICD-10-CM | POA: Diagnosis not present

## 2021-05-15 DIAGNOSIS — G4733 Obstructive sleep apnea (adult) (pediatric): Secondary | ICD-10-CM | POA: Diagnosis not present

## 2021-05-15 DIAGNOSIS — Z0000 Encounter for general adult medical examination without abnormal findings: Secondary | ICD-10-CM | POA: Diagnosis not present

## 2021-05-15 DIAGNOSIS — E538 Deficiency of other specified B group vitamins: Secondary | ICD-10-CM | POA: Diagnosis not present

## 2021-05-16 DIAGNOSIS — E1165 Type 2 diabetes mellitus with hyperglycemia: Secondary | ICD-10-CM | POA: Diagnosis not present

## 2021-05-17 DIAGNOSIS — E782 Mixed hyperlipidemia: Secondary | ICD-10-CM | POA: Diagnosis not present

## 2021-05-17 DIAGNOSIS — I952 Hypotension due to drugs: Secondary | ICD-10-CM | POA: Diagnosis not present

## 2021-05-17 DIAGNOSIS — R42 Dizziness and giddiness: Secondary | ICD-10-CM | POA: Diagnosis not present

## 2021-05-17 DIAGNOSIS — E119 Type 2 diabetes mellitus without complications: Secondary | ICD-10-CM | POA: Diagnosis not present

## 2021-05-17 DIAGNOSIS — I420 Dilated cardiomyopathy: Secondary | ICD-10-CM | POA: Diagnosis not present

## 2021-05-17 DIAGNOSIS — I5020 Unspecified systolic (congestive) heart failure: Secondary | ICD-10-CM | POA: Diagnosis not present

## 2021-05-23 DIAGNOSIS — G4733 Obstructive sleep apnea (adult) (pediatric): Secondary | ICD-10-CM | POA: Diagnosis not present

## 2021-05-25 ENCOUNTER — Ambulatory Visit: Payer: Self-pay | Admitting: Family Medicine

## 2021-05-30 DIAGNOSIS — G5600 Carpal tunnel syndrome, unspecified upper limb: Secondary | ICD-10-CM | POA: Diagnosis not present

## 2021-06-01 DIAGNOSIS — I425 Other restrictive cardiomyopathy: Secondary | ICD-10-CM | POA: Diagnosis not present

## 2021-06-01 DIAGNOSIS — R0600 Dyspnea, unspecified: Secondary | ICD-10-CM | POA: Diagnosis not present

## 2021-06-01 DIAGNOSIS — I110 Hypertensive heart disease with heart failure: Secondary | ICD-10-CM | POA: Diagnosis not present

## 2021-06-01 DIAGNOSIS — G4733 Obstructive sleep apnea (adult) (pediatric): Secondary | ICD-10-CM | POA: Diagnosis not present

## 2021-06-01 DIAGNOSIS — I5023 Acute on chronic systolic (congestive) heart failure: Secondary | ICD-10-CM | POA: Diagnosis not present

## 2021-06-08 DIAGNOSIS — E1165 Type 2 diabetes mellitus with hyperglycemia: Secondary | ICD-10-CM | POA: Diagnosis not present

## 2021-06-21 DIAGNOSIS — Z23 Encounter for immunization: Secondary | ICD-10-CM | POA: Diagnosis not present

## 2021-06-21 DIAGNOSIS — Z0000 Encounter for general adult medical examination without abnormal findings: Secondary | ICD-10-CM | POA: Diagnosis not present

## 2021-06-28 DIAGNOSIS — I10 Essential (primary) hypertension: Secondary | ICD-10-CM | POA: Diagnosis not present

## 2021-06-28 DIAGNOSIS — E782 Mixed hyperlipidemia: Secondary | ICD-10-CM | POA: Diagnosis not present

## 2021-06-28 DIAGNOSIS — I420 Dilated cardiomyopathy: Secondary | ICD-10-CM | POA: Diagnosis not present

## 2021-06-28 DIAGNOSIS — G4733 Obstructive sleep apnea (adult) (pediatric): Secondary | ICD-10-CM | POA: Diagnosis not present

## 2021-06-28 DIAGNOSIS — N182 Chronic kidney disease, stage 2 (mild): Secondary | ICD-10-CM | POA: Diagnosis not present

## 2021-06-28 DIAGNOSIS — R42 Dizziness and giddiness: Secondary | ICD-10-CM | POA: Diagnosis not present

## 2021-06-28 DIAGNOSIS — E119 Type 2 diabetes mellitus without complications: Secondary | ICD-10-CM | POA: Diagnosis not present

## 2021-06-28 DIAGNOSIS — R001 Bradycardia, unspecified: Secondary | ICD-10-CM | POA: Diagnosis not present

## 2021-06-28 DIAGNOSIS — I5020 Unspecified systolic (congestive) heart failure: Secondary | ICD-10-CM | POA: Diagnosis not present

## 2021-06-28 DIAGNOSIS — I952 Hypotension due to drugs: Secondary | ICD-10-CM | POA: Diagnosis not present

## 2021-07-01 DIAGNOSIS — G4733 Obstructive sleep apnea (adult) (pediatric): Secondary | ICD-10-CM | POA: Diagnosis not present

## 2021-07-01 DIAGNOSIS — I425 Other restrictive cardiomyopathy: Secondary | ICD-10-CM | POA: Diagnosis not present

## 2021-07-01 DIAGNOSIS — I5023 Acute on chronic systolic (congestive) heart failure: Secondary | ICD-10-CM | POA: Diagnosis not present

## 2021-07-01 DIAGNOSIS — I110 Hypertensive heart disease with heart failure: Secondary | ICD-10-CM | POA: Diagnosis not present

## 2021-07-01 DIAGNOSIS — R0600 Dyspnea, unspecified: Secondary | ICD-10-CM | POA: Diagnosis not present

## 2021-07-09 DIAGNOSIS — E1165 Type 2 diabetes mellitus with hyperglycemia: Secondary | ICD-10-CM | POA: Diagnosis not present

## 2021-07-12 ENCOUNTER — Ambulatory Visit: Payer: Self-pay | Admitting: Internal Medicine

## 2021-07-14 DIAGNOSIS — E1159 Type 2 diabetes mellitus with other circulatory complications: Secondary | ICD-10-CM | POA: Diagnosis not present

## 2021-07-14 DIAGNOSIS — E782 Mixed hyperlipidemia: Secondary | ICD-10-CM | POA: Diagnosis not present

## 2021-07-14 DIAGNOSIS — I152 Hypertension secondary to endocrine disorders: Secondary | ICD-10-CM | POA: Diagnosis not present

## 2021-07-14 DIAGNOSIS — E063 Autoimmune thyroiditis: Secondary | ICD-10-CM | POA: Diagnosis not present

## 2021-07-14 DIAGNOSIS — N182 Chronic kidney disease, stage 2 (mild): Secondary | ICD-10-CM | POA: Diagnosis not present

## 2021-07-14 DIAGNOSIS — E1122 Type 2 diabetes mellitus with diabetic chronic kidney disease: Secondary | ICD-10-CM | POA: Diagnosis not present

## 2021-07-14 DIAGNOSIS — Z794 Long term (current) use of insulin: Secondary | ICD-10-CM | POA: Diagnosis not present

## 2021-07-25 DIAGNOSIS — Z0000 Encounter for general adult medical examination without abnormal findings: Secondary | ICD-10-CM | POA: Diagnosis not present

## 2021-07-25 DIAGNOSIS — N1832 Chronic kidney disease, stage 3b: Secondary | ICD-10-CM | POA: Diagnosis not present

## 2021-07-25 DIAGNOSIS — E119 Type 2 diabetes mellitus without complications: Secondary | ICD-10-CM | POA: Diagnosis not present

## 2021-07-25 DIAGNOSIS — Z1389 Encounter for screening for other disorder: Secondary | ICD-10-CM | POA: Diagnosis not present

## 2021-07-25 DIAGNOSIS — G4733 Obstructive sleep apnea (adult) (pediatric): Secondary | ICD-10-CM | POA: Diagnosis not present

## 2021-07-26 ENCOUNTER — Other Ambulatory Visit: Payer: Self-pay | Admitting: Obstetrics

## 2021-07-26 DIAGNOSIS — N182 Chronic kidney disease, stage 2 (mild): Secondary | ICD-10-CM | POA: Diagnosis not present

## 2021-07-26 DIAGNOSIS — R42 Dizziness and giddiness: Secondary | ICD-10-CM | POA: Diagnosis not present

## 2021-07-26 DIAGNOSIS — Z13820 Encounter for screening for osteoporosis: Secondary | ICD-10-CM

## 2021-07-26 DIAGNOSIS — E119 Type 2 diabetes mellitus without complications: Secondary | ICD-10-CM | POA: Diagnosis not present

## 2021-07-26 DIAGNOSIS — G4733 Obstructive sleep apnea (adult) (pediatric): Secondary | ICD-10-CM | POA: Diagnosis not present

## 2021-07-26 DIAGNOSIS — M25551 Pain in right hip: Secondary | ICD-10-CM | POA: Diagnosis not present

## 2021-07-26 DIAGNOSIS — I10 Essential (primary) hypertension: Secondary | ICD-10-CM | POA: Diagnosis not present

## 2021-07-26 DIAGNOSIS — E782 Mixed hyperlipidemia: Secondary | ICD-10-CM | POA: Diagnosis not present

## 2021-07-26 DIAGNOSIS — I420 Dilated cardiomyopathy: Secondary | ICD-10-CM | POA: Diagnosis not present

## 2021-07-26 DIAGNOSIS — G8929 Other chronic pain: Secondary | ICD-10-CM | POA: Diagnosis not present

## 2021-07-26 DIAGNOSIS — I5020 Unspecified systolic (congestive) heart failure: Secondary | ICD-10-CM | POA: Diagnosis not present

## 2021-07-26 DIAGNOSIS — R001 Bradycardia, unspecified: Secondary | ICD-10-CM | POA: Diagnosis not present

## 2021-08-01 DIAGNOSIS — R0600 Dyspnea, unspecified: Secondary | ICD-10-CM | POA: Diagnosis not present

## 2021-08-01 DIAGNOSIS — I5023 Acute on chronic systolic (congestive) heart failure: Secondary | ICD-10-CM | POA: Diagnosis not present

## 2021-08-01 DIAGNOSIS — G4733 Obstructive sleep apnea (adult) (pediatric): Secondary | ICD-10-CM | POA: Diagnosis not present

## 2021-08-01 DIAGNOSIS — I110 Hypertensive heart disease with heart failure: Secondary | ICD-10-CM | POA: Diagnosis not present

## 2021-08-01 DIAGNOSIS — I425 Other restrictive cardiomyopathy: Secondary | ICD-10-CM | POA: Diagnosis not present

## 2021-08-04 IMAGING — MG DIGITAL SCREENING BILAT W/ TOMO W/ CAD
8 series · 8 of 24 positions shown · non-contrast
Comparison: Previous exam(s).

CLINICAL DATA: Screening.

EXAM:
DIGITAL SCREENING BILATERAL MAMMOGRAM WITH TOMO AND CAD

[L MLO synth-2D]
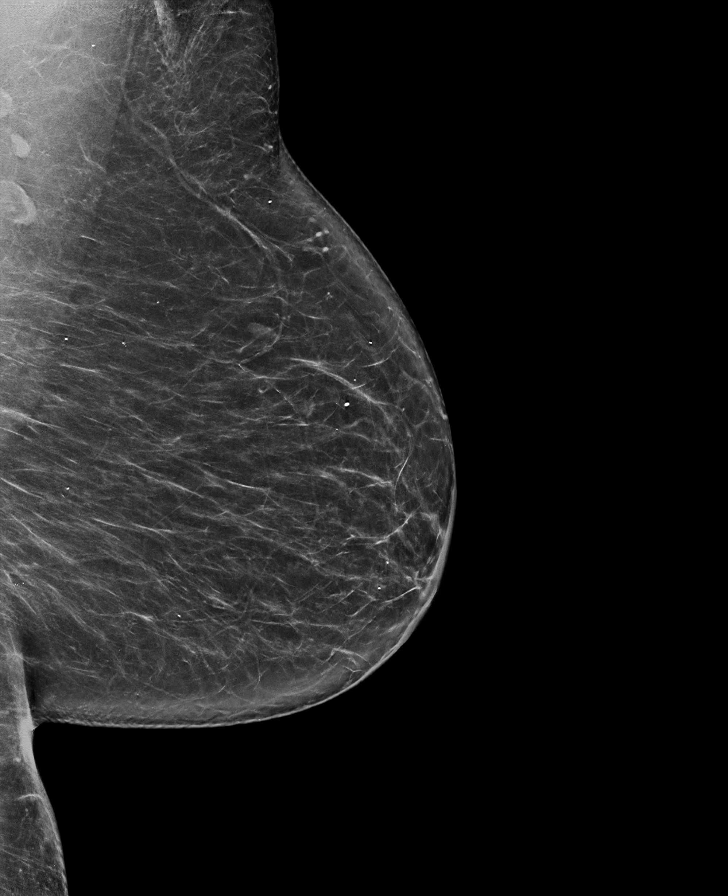

[L CC synth-2D]
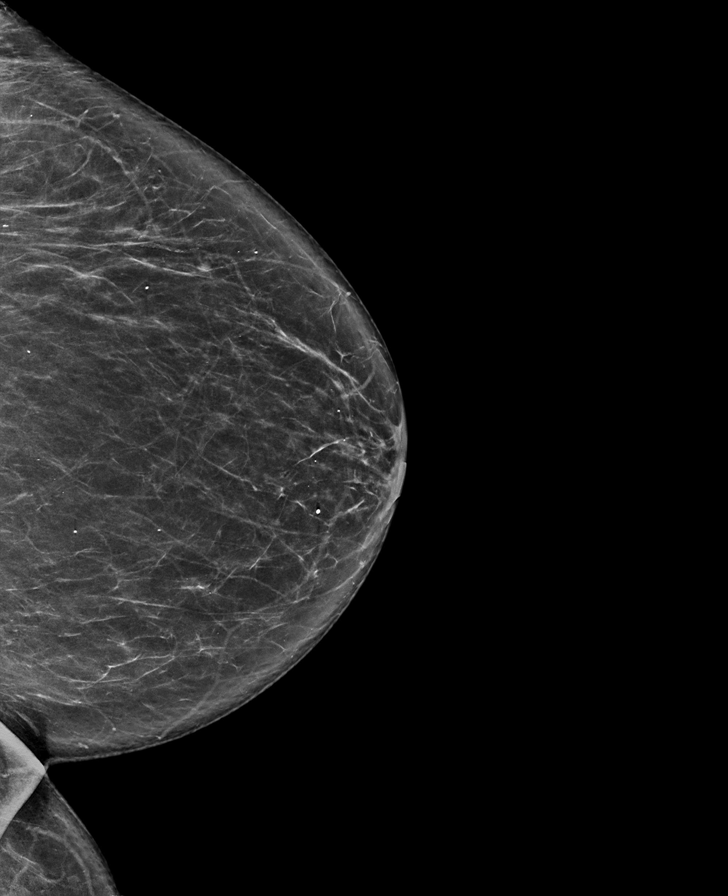

[R MLO synth-2D]
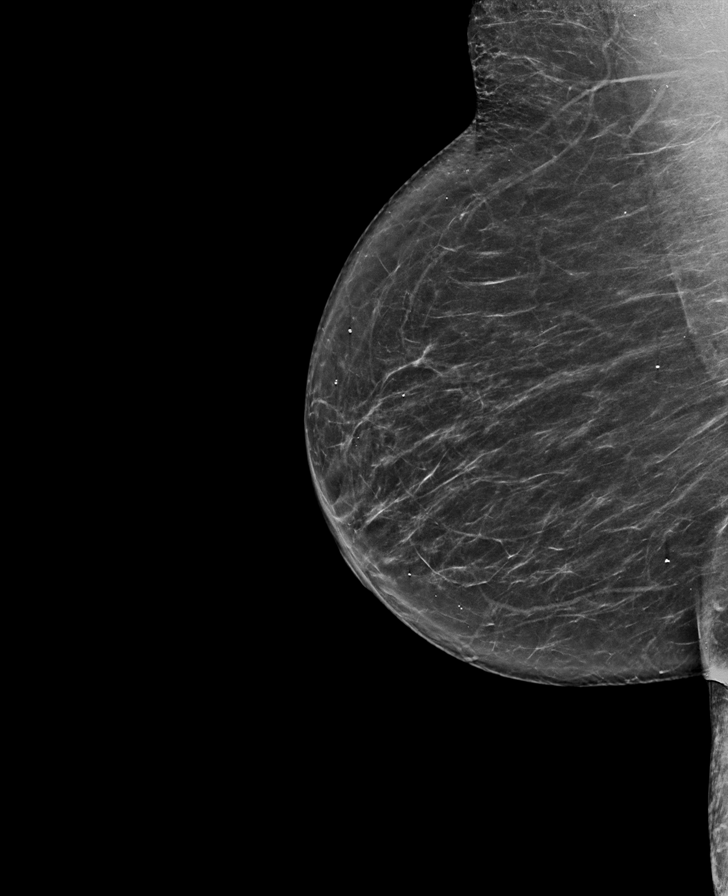

[R CC synth-2D]
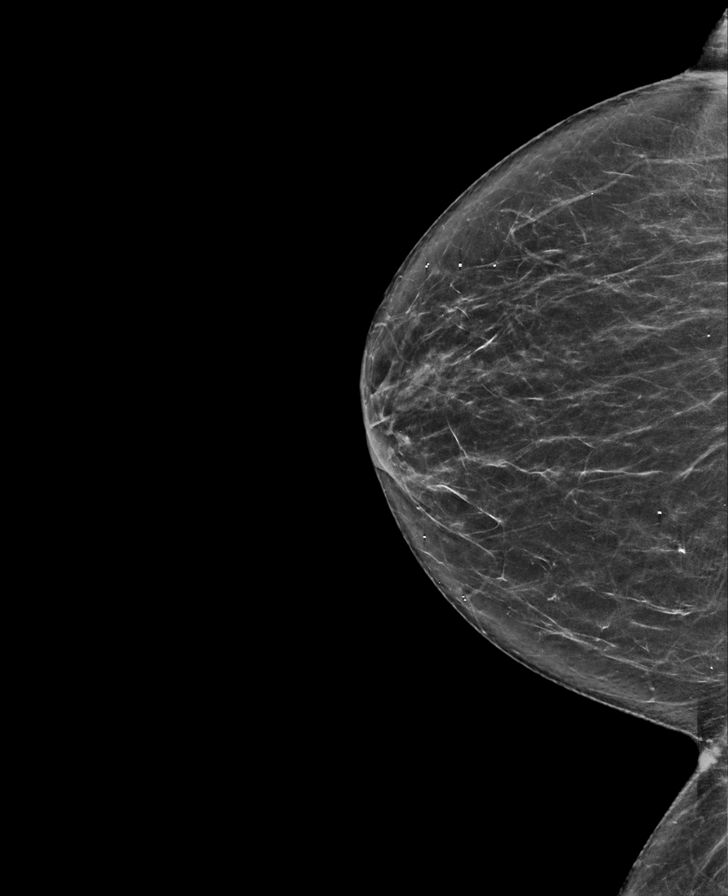

[L MLO tomo · tomo slice 40/79.0]
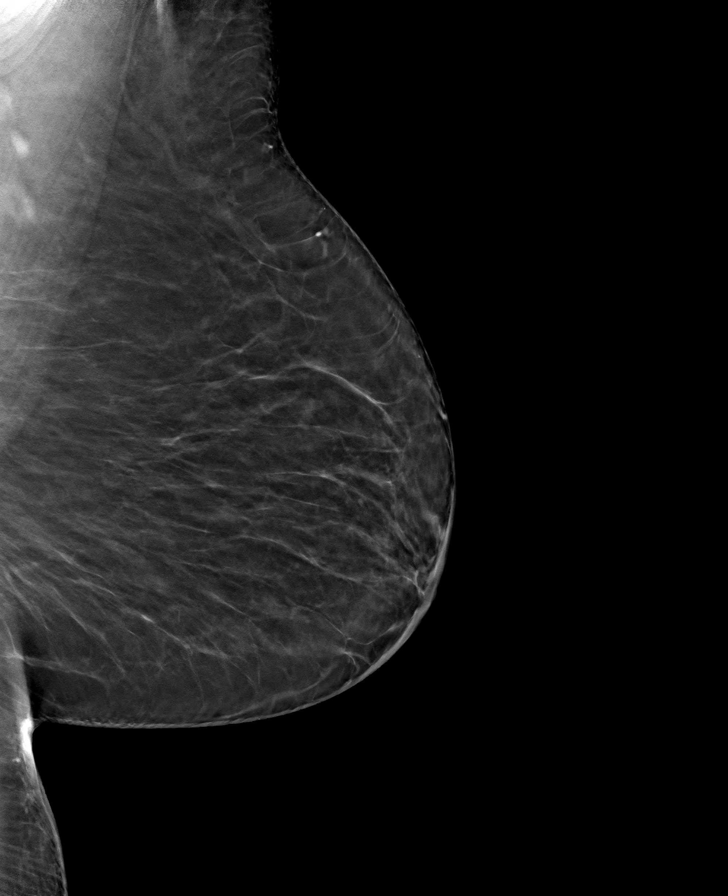

[R CC tomo · tomo slice 36/71.0]
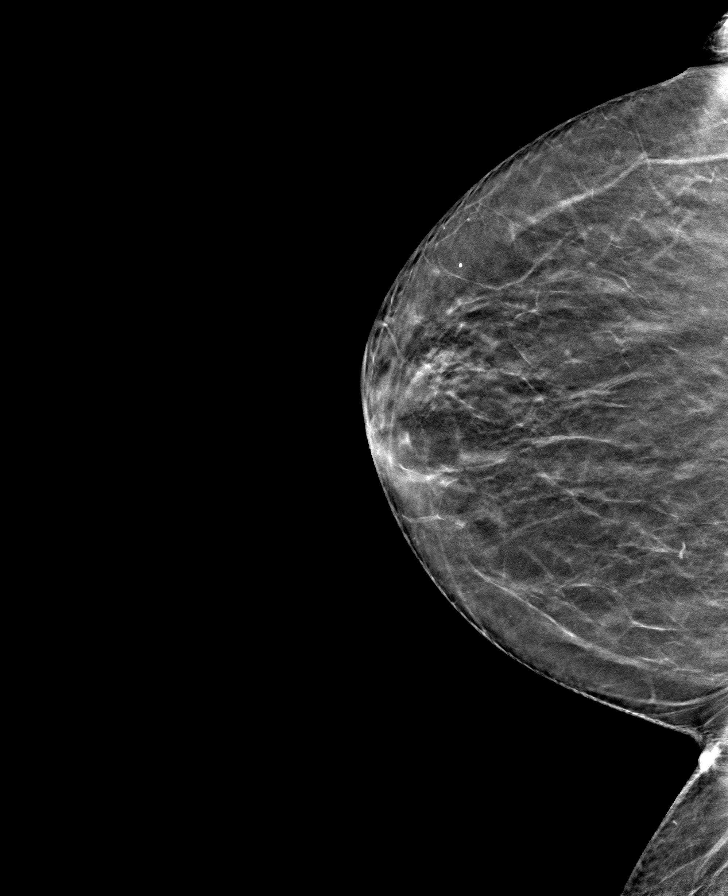

[L CC tomo · tomo slice 37/74.0]
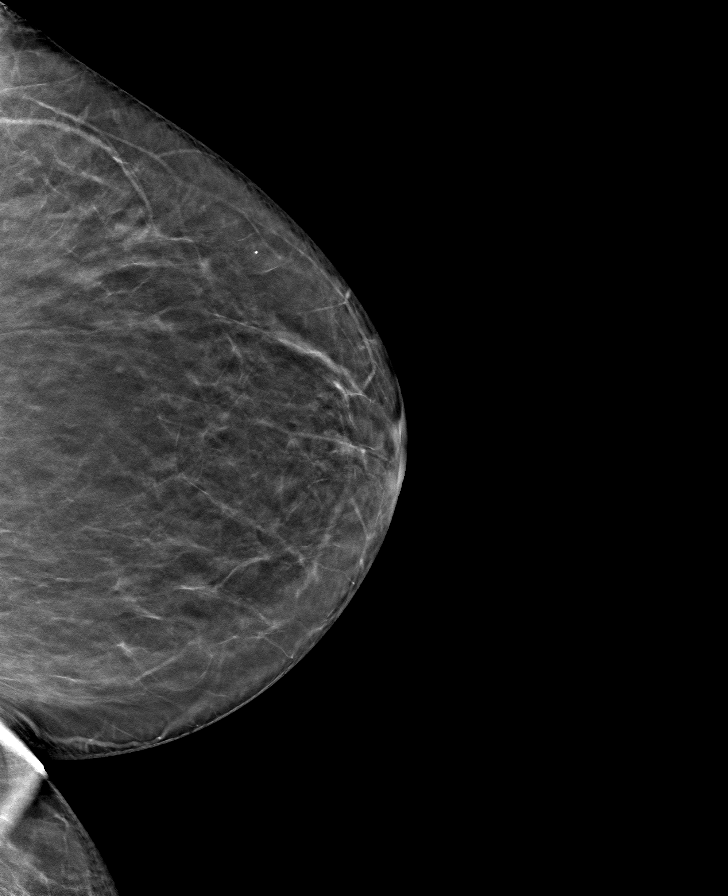

[R MLO tomo · tomo slice 38/75.0]
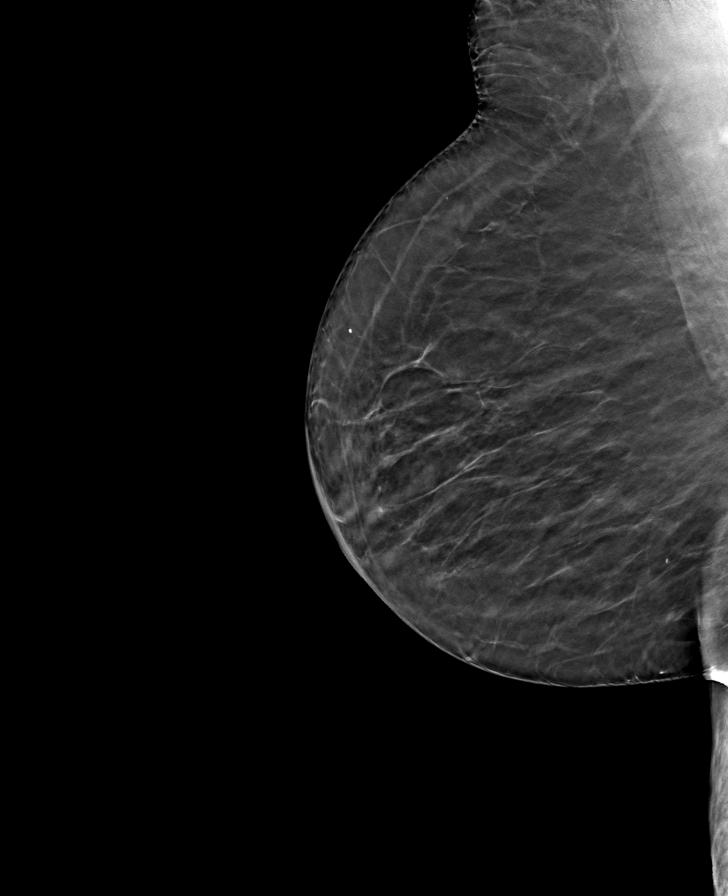

[8 of 24 positions shown; findings below may reference images not displayed]

ACR Breast Density Category b: There are scattered areas of
fibroglandular density.
FINDINGS: There are no findings suspicious for malignancy. Images were
processed with CAD.
IMPRESSION: No mammographic evidence of malignancy. A result letter of this
screening mammogram will be mailed directly to the patient.

RECOMMENDATION:
Screening mammogram in one year. (Code:[id number])

BI-RADS CATEGORY  1: Negative.

## 2021-08-09 DIAGNOSIS — E1165 Type 2 diabetes mellitus with hyperglycemia: Secondary | ICD-10-CM | POA: Diagnosis not present

## 2021-08-17 DIAGNOSIS — Z0130 Encounter for examination of blood pressure without abnormal findings: Secondary | ICD-10-CM | POA: Diagnosis not present

## 2021-08-17 DIAGNOSIS — R197 Diarrhea, unspecified: Secondary | ICD-10-CM | POA: Diagnosis not present

## 2021-08-17 DIAGNOSIS — Z0000 Encounter for general adult medical examination without abnormal findings: Secondary | ICD-10-CM | POA: Diagnosis not present

## 2021-08-22 DIAGNOSIS — R197 Diarrhea, unspecified: Secondary | ICD-10-CM | POA: Diagnosis not present

## 2021-08-28 ENCOUNTER — Other Ambulatory Visit: Payer: Self-pay

## 2021-08-28 ENCOUNTER — Ambulatory Visit
Admission: RE | Admit: 2021-08-28 | Discharge: 2021-08-28 | Disposition: A | Discharge status: Home / Self Care | Payer: Medicare Other | Source: Ambulatory Visit | Attending: Obstetrics | Admitting: Obstetrics

## 2021-08-28 DIAGNOSIS — M810 Age-related osteoporosis without current pathological fracture: Secondary | ICD-10-CM | POA: Insufficient documentation

## 2021-08-28 DIAGNOSIS — Z780 Asymptomatic menopausal state: Secondary | ICD-10-CM | POA: Insufficient documentation

## 2021-08-28 DIAGNOSIS — Z13820 Encounter for screening for osteoporosis: Secondary | ICD-10-CM | POA: Insufficient documentation

## 2021-08-28 DIAGNOSIS — M8588 Other specified disorders of bone density and structure, other site: Secondary | ICD-10-CM | POA: Diagnosis not present

## 2021-08-30 DIAGNOSIS — Z0130 Encounter for examination of blood pressure without abnormal findings: Secondary | ICD-10-CM | POA: Diagnosis not present

## 2021-08-30 DIAGNOSIS — R197 Diarrhea, unspecified: Secondary | ICD-10-CM | POA: Diagnosis not present

## 2021-08-30 DIAGNOSIS — Z0000 Encounter for general adult medical examination without abnormal findings: Secondary | ICD-10-CM | POA: Diagnosis not present

## 2021-09-11 DIAGNOSIS — E1165 Type 2 diabetes mellitus with hyperglycemia: Secondary | ICD-10-CM | POA: Diagnosis not present

## 2021-10-10 DIAGNOSIS — E119 Type 2 diabetes mellitus without complications: Secondary | ICD-10-CM | POA: Diagnosis not present

## 2021-10-12 DIAGNOSIS — E1165 Type 2 diabetes mellitus with hyperglycemia: Secondary | ICD-10-CM | POA: Diagnosis not present

## 2021-10-19 DIAGNOSIS — N1832 Chronic kidney disease, stage 3b: Secondary | ICD-10-CM | POA: Diagnosis not present

## 2021-10-19 DIAGNOSIS — I10 Essential (primary) hypertension: Secondary | ICD-10-CM | POA: Diagnosis not present

## 2021-10-19 DIAGNOSIS — Z0130 Encounter for examination of blood pressure without abnormal findings: Secondary | ICD-10-CM | POA: Diagnosis not present

## 2021-10-19 DIAGNOSIS — Z0000 Encounter for general adult medical examination without abnormal findings: Secondary | ICD-10-CM | POA: Diagnosis not present

## 2021-10-19 DIAGNOSIS — R197 Diarrhea, unspecified: Secondary | ICD-10-CM | POA: Diagnosis not present

## 2021-10-19 DIAGNOSIS — Z23 Encounter for immunization: Secondary | ICD-10-CM | POA: Diagnosis not present

## 2021-10-24 DIAGNOSIS — G4733 Obstructive sleep apnea (adult) (pediatric): Secondary | ICD-10-CM | POA: Diagnosis not present

## 2021-10-24 DIAGNOSIS — G479 Sleep disorder, unspecified: Secondary | ICD-10-CM | POA: Diagnosis not present

## 2021-10-24 DIAGNOSIS — R438 Other disturbances of smell and taste: Secondary | ICD-10-CM | POA: Diagnosis not present

## 2021-10-24 DIAGNOSIS — R413 Other amnesia: Secondary | ICD-10-CM | POA: Diagnosis not present

## 2021-10-24 DIAGNOSIS — Z9989 Dependence on other enabling machines and devices: Secondary | ICD-10-CM | POA: Diagnosis not present

## 2021-11-01 DIAGNOSIS — G4733 Obstructive sleep apnea (adult) (pediatric): Secondary | ICD-10-CM | POA: Diagnosis not present

## 2021-11-01 DIAGNOSIS — I420 Dilated cardiomyopathy: Secondary | ICD-10-CM | POA: Diagnosis not present

## 2021-11-01 DIAGNOSIS — I10 Essential (primary) hypertension: Secondary | ICD-10-CM | POA: Diagnosis not present

## 2021-11-01 DIAGNOSIS — E119 Type 2 diabetes mellitus without complications: Secondary | ICD-10-CM | POA: Diagnosis not present

## 2021-11-01 DIAGNOSIS — R42 Dizziness and giddiness: Secondary | ICD-10-CM | POA: Diagnosis not present

## 2021-11-01 DIAGNOSIS — N182 Chronic kidney disease, stage 2 (mild): Secondary | ICD-10-CM | POA: Diagnosis not present

## 2021-11-01 DIAGNOSIS — I5020 Unspecified systolic (congestive) heart failure: Secondary | ICD-10-CM | POA: Diagnosis not present

## 2021-11-01 DIAGNOSIS — I509 Heart failure, unspecified: Secondary | ICD-10-CM | POA: Diagnosis not present

## 2021-11-01 DIAGNOSIS — R001 Bradycardia, unspecified: Secondary | ICD-10-CM | POA: Diagnosis not present

## 2021-11-01 DIAGNOSIS — E782 Mixed hyperlipidemia: Secondary | ICD-10-CM | POA: Diagnosis not present

## 2021-11-12 DIAGNOSIS — E1165 Type 2 diabetes mellitus with hyperglycemia: Secondary | ICD-10-CM | POA: Diagnosis not present

## 2021-11-15 DIAGNOSIS — Z0130 Encounter for examination of blood pressure without abnormal findings: Secondary | ICD-10-CM | POA: Diagnosis not present

## 2021-11-15 DIAGNOSIS — Z0000 Encounter for general adult medical examination without abnormal findings: Secondary | ICD-10-CM | POA: Diagnosis not present

## 2021-11-15 DIAGNOSIS — M810 Age-related osteoporosis without current pathological fracture: Secondary | ICD-10-CM | POA: Diagnosis not present

## 2021-11-15 DIAGNOSIS — E119 Type 2 diabetes mellitus without complications: Secondary | ICD-10-CM | POA: Diagnosis not present

## 2021-11-16 DIAGNOSIS — I5020 Unspecified systolic (congestive) heart failure: Secondary | ICD-10-CM | POA: Diagnosis not present

## 2021-11-20 DIAGNOSIS — I5020 Unspecified systolic (congestive) heart failure: Secondary | ICD-10-CM | POA: Diagnosis not present

## 2021-12-22 DIAGNOSIS — Z794 Long term (current) use of insulin: Secondary | ICD-10-CM | POA: Diagnosis not present

## 2021-12-22 DIAGNOSIS — E063 Autoimmune thyroiditis: Secondary | ICD-10-CM | POA: Diagnosis not present

## 2021-12-22 DIAGNOSIS — I152 Hypertension secondary to endocrine disorders: Secondary | ICD-10-CM | POA: Diagnosis not present

## 2021-12-22 DIAGNOSIS — E1159 Type 2 diabetes mellitus with other circulatory complications: Secondary | ICD-10-CM | POA: Diagnosis not present

## 2021-12-22 DIAGNOSIS — E782 Mixed hyperlipidemia: Secondary | ICD-10-CM | POA: Diagnosis not present

## 2021-12-22 DIAGNOSIS — N182 Chronic kidney disease, stage 2 (mild): Secondary | ICD-10-CM | POA: Diagnosis not present

## 2021-12-22 DIAGNOSIS — E1122 Type 2 diabetes mellitus with diabetic chronic kidney disease: Secondary | ICD-10-CM | POA: Diagnosis not present

## 2021-12-28 ENCOUNTER — Other Ambulatory Visit: Payer: Self-pay | Admitting: Obstetrics

## 2021-12-28 DIAGNOSIS — Z1231 Encounter for screening mammogram for malignant neoplasm of breast: Secondary | ICD-10-CM

## 2022-01-02 DIAGNOSIS — E1122 Type 2 diabetes mellitus with diabetic chronic kidney disease: Secondary | ICD-10-CM | POA: Diagnosis not present

## 2022-01-31 DIAGNOSIS — M810 Age-related osteoporosis without current pathological fracture: Secondary | ICD-10-CM | POA: Diagnosis not present

## 2022-01-31 DIAGNOSIS — Z0000 Encounter for general adult medical examination without abnormal findings: Secondary | ICD-10-CM | POA: Diagnosis not present

## 2022-01-31 DIAGNOSIS — Z23 Encounter for immunization: Secondary | ICD-10-CM | POA: Diagnosis not present

## 2022-01-31 DIAGNOSIS — E039 Hypothyroidism, unspecified: Secondary | ICD-10-CM | POA: Diagnosis not present

## 2022-01-31 DIAGNOSIS — I509 Heart failure, unspecified: Secondary | ICD-10-CM | POA: Diagnosis not present

## 2022-01-31 DIAGNOSIS — N1832 Chronic kidney disease, stage 3b: Secondary | ICD-10-CM | POA: Diagnosis not present

## 2022-01-31 DIAGNOSIS — Z0130 Encounter for examination of blood pressure without abnormal findings: Secondary | ICD-10-CM | POA: Diagnosis not present

## 2022-01-31 DIAGNOSIS — E119 Type 2 diabetes mellitus without complications: Secondary | ICD-10-CM | POA: Diagnosis not present

## 2022-02-02 DIAGNOSIS — E1122 Type 2 diabetes mellitus with diabetic chronic kidney disease: Secondary | ICD-10-CM | POA: Diagnosis not present

## 2022-02-09 DIAGNOSIS — I5022 Chronic systolic (congestive) heart failure: Secondary | ICD-10-CM | POA: Diagnosis not present

## 2022-03-02 DIAGNOSIS — Z1389 Encounter for screening for other disorder: Secondary | ICD-10-CM | POA: Diagnosis not present

## 2022-03-02 DIAGNOSIS — E119 Type 2 diabetes mellitus without complications: Secondary | ICD-10-CM | POA: Diagnosis not present

## 2022-03-02 DIAGNOSIS — I509 Heart failure, unspecified: Secondary | ICD-10-CM | POA: Diagnosis not present

## 2022-03-02 DIAGNOSIS — M810 Age-related osteoporosis without current pathological fracture: Secondary | ICD-10-CM | POA: Diagnosis not present

## 2022-03-02 DIAGNOSIS — Z712 Person consulting for explanation of examination or test findings: Secondary | ICD-10-CM | POA: Diagnosis not present

## 2022-03-02 DIAGNOSIS — G4700 Insomnia, unspecified: Secondary | ICD-10-CM | POA: Diagnosis not present

## 2022-03-02 DIAGNOSIS — Z0130 Encounter for examination of blood pressure without abnormal findings: Secondary | ICD-10-CM | POA: Diagnosis not present

## 2022-03-05 DIAGNOSIS — E1122 Type 2 diabetes mellitus with diabetic chronic kidney disease: Secondary | ICD-10-CM | POA: Diagnosis not present

## 2022-04-05 DIAGNOSIS — E1122 Type 2 diabetes mellitus with diabetic chronic kidney disease: Secondary | ICD-10-CM | POA: Diagnosis not present

## 2022-04-25 DIAGNOSIS — I420 Dilated cardiomyopathy: Secondary | ICD-10-CM | POA: Diagnosis not present

## 2022-04-25 DIAGNOSIS — I5022 Chronic systolic (congestive) heart failure: Secondary | ICD-10-CM | POA: Diagnosis not present

## 2022-04-25 DIAGNOSIS — E782 Mixed hyperlipidemia: Secondary | ICD-10-CM | POA: Diagnosis not present

## 2022-04-25 DIAGNOSIS — G4733 Obstructive sleep apnea (adult) (pediatric): Secondary | ICD-10-CM | POA: Diagnosis not present

## 2022-04-25 DIAGNOSIS — E119 Type 2 diabetes mellitus without complications: Secondary | ICD-10-CM | POA: Diagnosis not present

## 2022-04-25 DIAGNOSIS — N182 Chronic kidney disease, stage 2 (mild): Secondary | ICD-10-CM | POA: Diagnosis not present

## 2022-04-25 DIAGNOSIS — R001 Bradycardia, unspecified: Secondary | ICD-10-CM | POA: Diagnosis not present

## 2022-04-25 DIAGNOSIS — I509 Heart failure, unspecified: Secondary | ICD-10-CM | POA: Diagnosis not present

## 2022-04-25 DIAGNOSIS — I5020 Unspecified systolic (congestive) heart failure: Secondary | ICD-10-CM | POA: Diagnosis not present

## 2022-04-26 ENCOUNTER — Ambulatory Visit
Admission: RE | Admit: 2022-04-26 | Discharge: 2022-04-26 | Disposition: A | Discharge status: Home / Self Care | Payer: Medicare Other | Source: Ambulatory Visit | Attending: Obstetrics | Admitting: Obstetrics

## 2022-04-26 DIAGNOSIS — Z1231 Encounter for screening mammogram for malignant neoplasm of breast: Secondary | ICD-10-CM | POA: Diagnosis not present

## 2022-05-04 DIAGNOSIS — M810 Age-related osteoporosis without current pathological fracture: Secondary | ICD-10-CM | POA: Diagnosis not present

## 2022-05-04 DIAGNOSIS — N182 Chronic kidney disease, stage 2 (mild): Secondary | ICD-10-CM | POA: Diagnosis not present

## 2022-05-04 DIAGNOSIS — Z794 Long term (current) use of insulin: Secondary | ICD-10-CM | POA: Diagnosis not present

## 2022-05-04 DIAGNOSIS — E1122 Type 2 diabetes mellitus with diabetic chronic kidney disease: Secondary | ICD-10-CM | POA: Diagnosis not present

## 2022-05-06 DIAGNOSIS — E1122 Type 2 diabetes mellitus with diabetic chronic kidney disease: Secondary | ICD-10-CM | POA: Diagnosis not present

## 2022-05-21 DIAGNOSIS — Z0130 Encounter for examination of blood pressure without abnormal findings: Secondary | ICD-10-CM | POA: Diagnosis not present

## 2022-05-21 DIAGNOSIS — I10 Essential (primary) hypertension: Secondary | ICD-10-CM | POA: Diagnosis not present

## 2022-05-21 DIAGNOSIS — E162 Hypoglycemia, unspecified: Secondary | ICD-10-CM | POA: Diagnosis not present

## 2022-05-21 DIAGNOSIS — Z712 Person consulting for explanation of examination or test findings: Secondary | ICD-10-CM | POA: Diagnosis not present

## 2022-05-21 DIAGNOSIS — Z1389 Encounter for screening for other disorder: Secondary | ICD-10-CM | POA: Diagnosis not present

## 2022-05-21 DIAGNOSIS — Z0000 Encounter for general adult medical examination without abnormal findings: Secondary | ICD-10-CM | POA: Diagnosis not present

## 2022-06-06 DIAGNOSIS — E1122 Type 2 diabetes mellitus with diabetic chronic kidney disease: Secondary | ICD-10-CM | POA: Diagnosis not present

## 2022-07-18 DIAGNOSIS — Z23 Encounter for immunization: Secondary | ICD-10-CM | POA: Diagnosis not present

## 2022-07-19 DIAGNOSIS — E1122 Type 2 diabetes mellitus with diabetic chronic kidney disease: Secondary | ICD-10-CM | POA: Diagnosis not present

## 2022-08-01 DIAGNOSIS — I509 Heart failure, unspecified: Secondary | ICD-10-CM | POA: Diagnosis not present

## 2022-08-01 DIAGNOSIS — Z0131 Encounter for examination of blood pressure with abnormal findings: Secondary | ICD-10-CM | POA: Diagnosis not present

## 2022-08-01 DIAGNOSIS — M810 Age-related osteoporosis without current pathological fracture: Secondary | ICD-10-CM | POA: Diagnosis not present

## 2022-08-01 DIAGNOSIS — Z0000 Encounter for general adult medical examination without abnormal findings: Secondary | ICD-10-CM | POA: Diagnosis not present

## 2022-08-01 DIAGNOSIS — Z0130 Encounter for examination of blood pressure without abnormal findings: Secondary | ICD-10-CM | POA: Diagnosis not present

## 2022-08-01 DIAGNOSIS — E119 Type 2 diabetes mellitus without complications: Secondary | ICD-10-CM | POA: Diagnosis not present

## 2022-08-01 DIAGNOSIS — Z1389 Encounter for screening for other disorder: Secondary | ICD-10-CM | POA: Diagnosis not present

## 2022-08-01 DIAGNOSIS — E162 Hypoglycemia, unspecified: Secondary | ICD-10-CM | POA: Diagnosis not present

## 2022-08-01 DIAGNOSIS — Z23 Encounter for immunization: Secondary | ICD-10-CM | POA: Diagnosis not present

## 2022-08-06 DIAGNOSIS — N1832 Chronic kidney disease, stage 3b: Secondary | ICD-10-CM | POA: Diagnosis not present

## 2022-08-06 DIAGNOSIS — I10 Essential (primary) hypertension: Secondary | ICD-10-CM | POA: Diagnosis not present

## 2022-08-06 DIAGNOSIS — Z1389 Encounter for screening for other disorder: Secondary | ICD-10-CM | POA: Diagnosis not present

## 2022-08-06 DIAGNOSIS — I509 Heart failure, unspecified: Secondary | ICD-10-CM | POA: Diagnosis not present

## 2022-08-06 DIAGNOSIS — Z0130 Encounter for examination of blood pressure without abnormal findings: Secondary | ICD-10-CM | POA: Diagnosis not present

## 2022-08-13 DIAGNOSIS — N182 Chronic kidney disease, stage 2 (mild): Secondary | ICD-10-CM | POA: Diagnosis not present

## 2022-08-13 DIAGNOSIS — E1122 Type 2 diabetes mellitus with diabetic chronic kidney disease: Secondary | ICD-10-CM | POA: Diagnosis not present

## 2022-08-13 DIAGNOSIS — N1832 Chronic kidney disease, stage 3b: Secondary | ICD-10-CM | POA: Diagnosis not present

## 2022-08-13 DIAGNOSIS — E1159 Type 2 diabetes mellitus with other circulatory complications: Secondary | ICD-10-CM | POA: Diagnosis not present

## 2022-08-13 DIAGNOSIS — I152 Hypertension secondary to endocrine disorders: Secondary | ICD-10-CM | POA: Diagnosis not present

## 2022-08-13 DIAGNOSIS — Z1389 Encounter for screening for other disorder: Secondary | ICD-10-CM | POA: Diagnosis not present

## 2022-08-13 DIAGNOSIS — Z794 Long term (current) use of insulin: Secondary | ICD-10-CM | POA: Diagnosis not present

## 2022-08-13 DIAGNOSIS — M810 Age-related osteoporosis without current pathological fracture: Secondary | ICD-10-CM | POA: Diagnosis not present

## 2022-08-13 DIAGNOSIS — I509 Heart failure, unspecified: Secondary | ICD-10-CM | POA: Diagnosis not present

## 2022-08-13 DIAGNOSIS — E1169 Type 2 diabetes mellitus with other specified complication: Secondary | ICD-10-CM | POA: Diagnosis not present

## 2022-08-13 DIAGNOSIS — I10 Essential (primary) hypertension: Secondary | ICD-10-CM | POA: Diagnosis not present

## 2022-08-13 DIAGNOSIS — E785 Hyperlipidemia, unspecified: Secondary | ICD-10-CM | POA: Diagnosis not present

## 2022-08-13 DIAGNOSIS — E063 Autoimmune thyroiditis: Secondary | ICD-10-CM | POA: Diagnosis not present

## 2022-08-13 DIAGNOSIS — Z0130 Encounter for examination of blood pressure without abnormal findings: Secondary | ICD-10-CM | POA: Diagnosis not present

## 2022-08-19 DIAGNOSIS — E1122 Type 2 diabetes mellitus with diabetic chronic kidney disease: Secondary | ICD-10-CM | POA: Diagnosis not present

## 2022-09-19 DIAGNOSIS — E1122 Type 2 diabetes mellitus with diabetic chronic kidney disease: Secondary | ICD-10-CM | POA: Diagnosis not present

## 2022-09-19 DIAGNOSIS — E119 Type 2 diabetes mellitus without complications: Secondary | ICD-10-CM | POA: Diagnosis not present

## 2022-09-19 DIAGNOSIS — I428 Other cardiomyopathies: Secondary | ICD-10-CM | POA: Diagnosis not present

## 2022-09-19 DIAGNOSIS — I5020 Unspecified systolic (congestive) heart failure: Secondary | ICD-10-CM | POA: Diagnosis not present

## 2022-09-19 DIAGNOSIS — E782 Mixed hyperlipidemia: Secondary | ICD-10-CM | POA: Diagnosis not present

## 2022-09-19 DIAGNOSIS — I10 Essential (primary) hypertension: Secondary | ICD-10-CM | POA: Diagnosis not present

## 2022-09-27 DIAGNOSIS — Z1389 Encounter for screening for other disorder: Secondary | ICD-10-CM | POA: Diagnosis not present

## 2022-09-27 DIAGNOSIS — N1832 Chronic kidney disease, stage 3b: Secondary | ICD-10-CM | POA: Diagnosis not present

## 2022-09-27 DIAGNOSIS — Z0130 Encounter for examination of blood pressure without abnormal findings: Secondary | ICD-10-CM | POA: Diagnosis not present

## 2022-09-27 DIAGNOSIS — Z0000 Encounter for general adult medical examination without abnormal findings: Secondary | ICD-10-CM | POA: Diagnosis not present

## 2022-10-12 DIAGNOSIS — E119 Type 2 diabetes mellitus without complications: Secondary | ICD-10-CM | POA: Diagnosis not present

## 2022-10-12 DIAGNOSIS — H2513 Age-related nuclear cataract, bilateral: Secondary | ICD-10-CM | POA: Diagnosis not present

## 2022-10-18 DIAGNOSIS — E1122 Type 2 diabetes mellitus with diabetic chronic kidney disease: Secondary | ICD-10-CM | POA: Diagnosis not present

## 2022-10-23 DIAGNOSIS — H2511 Age-related nuclear cataract, right eye: Secondary | ICD-10-CM | POA: Diagnosis not present

## 2022-10-23 DIAGNOSIS — H2512 Age-related nuclear cataract, left eye: Secondary | ICD-10-CM | POA: Diagnosis not present

## 2022-10-29 ENCOUNTER — Encounter: Payer: Self-pay | Admitting: Ophthalmology

## 2022-10-30 DIAGNOSIS — Z1389 Encounter for screening for other disorder: Secondary | ICD-10-CM | POA: Diagnosis not present

## 2022-10-30 DIAGNOSIS — Z0130 Encounter for examination of blood pressure without abnormal findings: Secondary | ICD-10-CM | POA: Diagnosis not present

## 2022-10-30 DIAGNOSIS — I509 Heart failure, unspecified: Secondary | ICD-10-CM | POA: Diagnosis not present

## 2022-10-30 DIAGNOSIS — Z0000 Encounter for general adult medical examination without abnormal findings: Secondary | ICD-10-CM | POA: Diagnosis not present

## 2022-10-30 DIAGNOSIS — L603 Nail dystrophy: Secondary | ICD-10-CM | POA: Diagnosis not present

## 2022-10-30 DIAGNOSIS — M810 Age-related osteoporosis without current pathological fracture: Secondary | ICD-10-CM | POA: Diagnosis not present

## 2022-10-30 DIAGNOSIS — E11649 Type 2 diabetes mellitus with hypoglycemia without coma: Secondary | ICD-10-CM | POA: Diagnosis not present

## 2022-11-01 NOTE — Discharge Instructions (Signed)

## 2022-11-05 ENCOUNTER — Ambulatory Visit: Payer: 59 | Admitting: Anesthesiology

## 2022-11-05 ENCOUNTER — Other Ambulatory Visit: Payer: Self-pay

## 2022-11-05 ENCOUNTER — Encounter: Payer: Self-pay | Admitting: Ophthalmology

## 2022-11-05 ENCOUNTER — Ambulatory Visit
Admission: RE | Admit: 2022-11-05 | Discharge: 2022-11-05 | Disposition: A | Discharge status: Home / Self Care | Payer: 59 | Attending: Ophthalmology | Admitting: Ophthalmology

## 2022-11-05 ENCOUNTER — Encounter
Admission: RE | Disposition: A | Discharge status: Home / Self Care | Payer: Self-pay | Source: Home / Self Care | Attending: Ophthalmology

## 2022-11-05 DIAGNOSIS — E1136 Type 2 diabetes mellitus with diabetic cataract: Secondary | ICD-10-CM | POA: Diagnosis not present

## 2022-11-05 DIAGNOSIS — E785 Hyperlipidemia, unspecified: Secondary | ICD-10-CM | POA: Diagnosis not present

## 2022-11-05 DIAGNOSIS — I110 Hypertensive heart disease with heart failure: Secondary | ICD-10-CM | POA: Insufficient documentation

## 2022-11-05 DIAGNOSIS — Z7984 Long term (current) use of oral hypoglycemic drugs: Secondary | ICD-10-CM | POA: Insufficient documentation

## 2022-11-05 DIAGNOSIS — Z794 Long term (current) use of insulin: Secondary | ICD-10-CM | POA: Diagnosis not present

## 2022-11-05 DIAGNOSIS — H2511 Age-related nuclear cataract, right eye: Secondary | ICD-10-CM | POA: Diagnosis not present

## 2022-11-05 DIAGNOSIS — E119 Type 2 diabetes mellitus without complications: Secondary | ICD-10-CM | POA: Diagnosis not present

## 2022-11-05 DIAGNOSIS — I509 Heart failure, unspecified: Secondary | ICD-10-CM | POA: Diagnosis not present

## 2022-11-05 DIAGNOSIS — Z6831 Body mass index (BMI) 31.0-31.9, adult: Secondary | ICD-10-CM | POA: Diagnosis not present

## 2022-11-05 DIAGNOSIS — M1711 Unilateral primary osteoarthritis, right knee: Secondary | ICD-10-CM | POA: Insufficient documentation

## 2022-11-05 DIAGNOSIS — E669 Obesity, unspecified: Secondary | ICD-10-CM | POA: Insufficient documentation

## 2022-11-05 HISTORY — PX: CATARACT EXTRACTION W/PHACO: SHX586

## 2022-11-05 HISTORY — DX: Presence of dental prosthetic device (complete) (partial): Z97.2

## 2022-11-05 LAB — GLUCOSE, CAPILLARY: Glucose-Capillary: 126 mg/dL — ABNORMAL HIGH (ref 70–99)

## 2022-11-05 SURGERY — PHACOEMULSIFICATION, CATARACT, WITH IOL INSERTION
Anesthesia: General | Site: Eye | Laterality: Right

## 2022-11-05 MED ORDER — LIDOCAINE HCL (PF) 2 % IJ SOLN
INTRAOCULAR | Status: DC | PRN
  Administered 2022-11-05: 4 mL via INTRAOCULAR

## 2022-11-05 MED ORDER — MOXIFLOXACIN HCL 0.5 % OP SOLN
OPHTHALMIC | Status: DC | PRN
  Administered 2022-11-05: .2 mL via OPHTHALMIC

## 2022-11-05 MED ORDER — ARMC OPHTHALMIC DILATING DROPS
1.0000 | OPHTHALMIC | Status: DC | PRN
  Administered 2022-11-05 (×3): 1 via OPHTHALMIC

## 2022-11-05 MED ORDER — TETRACAINE HCL 0.5 % OP SOLN
1.0000 [drp] | OPHTHALMIC | Status: DC | PRN
  Administered 2022-11-05 (×3): 1 [drp] via OPHTHALMIC

## 2022-11-05 MED ORDER — FENTANYL CITRATE (PF) 100 MCG/2ML IJ SOLN
INTRAMUSCULAR | Status: DC | PRN
  Administered 2022-11-05: 25 ug via INTRAVENOUS

## 2022-11-05 MED ORDER — MIDAZOLAM HCL 2 MG/2ML IJ SOLN
INTRAMUSCULAR | Status: DC | PRN
  Administered 2022-11-05: 1 mg via INTRAVENOUS

## 2022-11-05 MED ORDER — LACTATED RINGERS IV SOLN: INTRAVENOUS | Status: DC

## 2022-11-05 MED ORDER — SIGHTPATH DOSE#1 BSS IO SOLN
INTRAOCULAR | Status: DC | PRN
  Administered 2022-11-05: 91 mL via OPHTHALMIC

## 2022-11-05 MED ORDER — SIGHTPATH DOSE#1 NA HYALUR & NA CHOND-NA HYALUR IO KIT
PACK | INTRAOCULAR | Status: DC | PRN
  Administered 2022-11-05: 1 via OPHTHALMIC

## 2022-11-05 MED ORDER — SIGHTPATH DOSE#1 BSS IO SOLN
INTRAOCULAR | Status: DC | PRN
  Administered 2022-11-05: 15 mL via INTRAOCULAR

## 2022-11-05 SURGICAL SUPPLY — 15 items
CANNULA ANT/CHMB 27G (MISCELLANEOUS) IMPLANT
CANNULA ANT/CHMB 27GA (MISCELLANEOUS) IMPLANT
CATARACT SUITE SIGHTPATH (MISCELLANEOUS) ×1 IMPLANT
DISSECTOR HYDRO NUCLEUS 50X22 (MISCELLANEOUS) ×2 IMPLANT
FEE CATARACT SUITE SIGHTPATH (MISCELLANEOUS) ×2 IMPLANT
GLOVE SURG GAMMEX PI TX LF 7.5 (GLOVE) ×2 IMPLANT
GLOVE SURG SYN 8.5  E (GLOVE) ×1
GLOVE SURG SYN 8.5 E (GLOVE) ×1 IMPLANT
GLOVE SURG SYN 8.5 PF PI (GLOVE) ×2 IMPLANT
LENS IOL TECNIS EYHANCE 23.5 (Intraocular Lens) IMPLANT
NDL FILTER BLUNT 18X1 1/2 (NEEDLE) ×2 IMPLANT
NEEDLE FILTER BLUNT 18X1 1/2 (NEEDLE) ×1 IMPLANT
SYR 3ML LL SCALE MARK (SYRINGE) ×2 IMPLANT
SYR 5ML LL (SYRINGE) ×2 IMPLANT
WATER STERILE IRR 250ML POUR (IV SOLUTION) ×2 IMPLANT

## 2022-11-05 NOTE — H&P (Signed)
[facility]   Primary Care Physician:  Richardean Sale, MD Ophthalmologist: Dr. Benay Pillow  Pre-Procedure History & Physical: HPI:  Grace [name] is a 82 y.o. female here for cataract surgery.   Past Medical History:  Diagnosis Date   Allergic rhinitis    B12 deficiency    Carpal tunnel syndrome    Chronic hip pain    Chronic insomnia    Diabetes mellitus    Type II   Hyperlipemia    Hypertension    Idiopathic neuropathy    Left thyroid nodule    Multinodular goiter    Obesity    Osteoarthritis    Osteoarthritis of right knee    Wears dentures    full upper and lower    Past Surgical History:  Procedure Laterality Date   BARIATRIC SURGERY     10-27-09 per Dr Hinton Dyer Portenier at [place]  2008   per Dr. Sharlett Iles, repeat 5 yrs    COLONOSCOPY     JOINT REPLACEMENT  05   total knee replacement   LEFT HEART CATH AND CORONARY ANGIOGRAPHY N/A 10/27/2018   Procedure: LEFT HEART CATH AND CORONARY ANGIOGRAPHY;  Surgeon: Yolonda Kida, MD;  Location: [place] [facility];  Service: Cardiovascular;  Laterality: N/A;    Prior to Admission medications   Medication Sig Start Date End Date Taking? Authorizing Provider  acetaminophen (TYLENOL) 650 MG CR tablet Take 650 mg by mouth every 8 (eight) hours as needed for pain.   Yes [provider]  Cholecalciferol (VITAMIN D3) 50 MCG (2000 UT) capsule Take 2,000 Units by mouth daily.   Yes [provider]  diclofenac Sodium (VOLTAREN) 1 % GEL Apply topically 4 (four) times daily as needed.   Yes [provider]  furosemide (LASIX) 40 MG tablet Take 1 tablet (40 mg total) by mouth daily. Patient taking differently: Take 20 mg by mouth daily. 11/14/11  Yes Laurey Morale, MD  levothyroxine (SYNTHROID, LEVOTHROID) 25 MCG tablet Take 25 mcg by mouth daily before breakfast.   Yes [provider]  losartan (COZAAR) 25 MG tablet Take 25 mg by mouth  daily.   Yes [provider]  Melatonin 10 MG TABS Take by mouth at bedtime as needed.   Yes [provider]  metFORMIN (GLUCOPHAGE) 500 MG tablet TAKE 2 TABLETS BY MOUTH TWICE DAILY Patient taking differently: Take 1,000 mg by mouth 2 (two) times daily with a meal. 11/11/12  Yes Laurey Morale, MD  metoprolol succinate (TOPROL-XL) 25 MG 24 hr tablet Take 25 mg by mouth daily.   Yes [provider]  simvastatin (ZOCOR) 40 MG tablet TAKE ONE TABLET BY MOUTH DAILY Patient taking differently: Take 40 mg by mouth daily. 08/18/12  Yes Laurey Morale, MD  insulin glargine (LANTUS) 100 UNIT/ML injection Inject 10 Units into the skin 2 (two) times daily.  Patient not taking: Reported on 10/29/2022 11/14/11   Laurey Morale, MD    Allergies as of 10/16/2022 - Review Complete 10/27/2018  Allergen Reaction Noted   Wool alcohol  [lanolin] Rash 11/04/2014    Family History  Problem Relation Age of Onset   Arthritis Other    Diabetes Other    Hypertension Other    Heart block Other    Coronary artery disease Other    Breast cancer Sister 31    Social History   Socioeconomic History   Marital status: Widowed  Spouse name: Not on file   Number of children: Not on file   Years of education: Not on file   Highest education level: Not on file  Occupational History   Not on file  Tobacco Use   Smoking status: Never   Smokeless tobacco: Former  Scientific laboratory technician Use: Never used  Substance and Sexual Activity   Alcohol use: Yes    Comment: once every couple of months   Drug use: No   Sexual activity: Not on file  Other Topics Concern   Not on file  Social History Narrative   Not on file   Social Determinants of Health   Financial Resource Strain: Not on file  Food Insecurity: Not on file  Transportation Needs: Not on file  Physical Activity: Not on file  Stress: Not on file  Social Connections: Not on file  Intimate Partner Violence: Not on file     Review of Systems: See HPI, otherwise negative ROS  Physical Exam: BP (!) 120/59   Pulse 71   Temp 98.2 F (36.8 C) (Temporal)   Ht '5\' 1"'$  (1.549 m)   Wt 75.8 kg   SpO2 99%   BMI 31.55 kg/m  General:   Alert, cooperative in NAD Head:  Normocephalic and atraumatic. Respiratory:  Normal work of breathing. Cardiovascular:  RRR  Impression/Plan: Grace [name] is here for cataract surgery.  Risks, benefits, limitations, and alternatives regarding cataract surgery have been reviewed with the patient.  Questions have been answered.  All parties agreeable.   Benay Pillow, MD  11/05/2022, 7:58 AM

## 2022-11-05 NOTE — Anesthesia Postprocedure Evaluation (Signed)
Anesthesia Post Note  Patient: Grace [name]  Procedure(s) Performed: CATARACT EXTRACTION PHACO AND INTRAOCULAR LENS PLACEMENT (IOC) RIGHT DIABETIC 8.76 00:48.5 (Right: Eye)  Patient location during evaluation: PACU Anesthesia Type: General Level of consciousness: awake and alert Pain management: pain level controlled Vital Signs Assessment: post-procedure vital signs reviewed and stable Respiratory status: spontaneous breathing, nonlabored ventilation, respiratory function stable and patient connected to nasal cannula oxygen Cardiovascular status: stable and blood pressure returned to baseline Postop Assessment: no apparent nausea or vomiting Anesthetic complications: no   No notable events documented.   Last Vitals:  Vitals:   11/05/22 0831 11/05/22 0834  BP: (!) 90/51 (!) 102/58  Pulse: 63 64  Resp: 13 15  Temp: (!) 36.3 C (!) 36.3 C  SpO2: 99% 99%    Last Pain:  Vitals:   11/05/22 0834  TempSrc:   PainSc: 0-No pain                 Martha Clan

## 2022-11-05 NOTE — Op Note (Signed)
OPERATIVE NOTE  Grace [name] [id number] 11/05/2022   PREOPERATIVE DIAGNOSIS:  Nuclear sclerotic cataract right eye.  H25.11   POSTOPERATIVE DIAGNOSIS:    Nuclear sclerotic cataract right eye.     PROCEDURE:  Phacoemusification with posterior chamber intraocular lens placement of the right eye   LENS:   Implant Name Type Inv. Item Serial No. Manufacturer Lot No. LRB No. Used Action  LENS IOL TECNIS EYHANCE 23.5 - [id number] Intraocular Lens LENS IOL TECNIS EYHANCE 23.5 [id number] [employer]  Right 1 Implanted       Procedure(s) with comments: CATARACT EXTRACTION PHACO AND INTRAOCULAR LENS PLACEMENT (IOC) RIGHT DIABETIC 8.76 00:48.5 (Right) - Diabetic  DIB00 +23.5   ULTRASOUND TIME: 0 minutes 48 seconds.  CDE 8.76   SURGEON:  Benay Pillow, MD, MPH  ANESTHESIOLOGIST: Anesthesiologist: Martha Clan, MD CRNA: Gigi Gin, CRNA   ANESTHESIA:  Topical with tetracaine drops augmented with 1% preservative-free intracameral lidocaine.  ESTIMATED BLOOD LOSS: less than 1 mL.   COMPLICATIONS:  None.   DESCRIPTION OF PROCEDURE:  The patient was identified in the holding room and transported to the operating room and placed in the supine position under the operating microscope.  The right eye was identified as the operative eye and it was prepped and draped in the usual sterile ophthalmic fashion.   A 1.0 millimeter clear-corneal paracentesis was made at the 10:30 position. 0.5 ml of preservative-free 1% lidocaine with epinephrine was injected into the anterior chamber.  The anterior chamber was filled with viscoelastic.  A 2.4 millimeter keratome was used to make a near-clear corneal incision at the 8:00 position.  A curvilinear capsulorrhexis was made with a cystotome and capsulorrhexis forceps.  Balanced salt solution was used to hydrodissect and hydrodelineate the nucleus.   Phacoemulsification was then used in stop and chop fashion to remove the lens nucleus and epinucleus.  The  remaining cortex was then removed using the irrigation and aspiration handpiece. Viscoelastic was then placed into the capsular bag to distend it for lens placement.  A lens was then injected into the capsular bag.  The remaining viscoelastic was aspirated.   Wounds were hydrated with balanced salt solution.  The anterior chamber was inflated to a physiologic pressure with balanced salt solution.   Intracameral vigamox 0.1 mL undiluted was injected into the eye and a drop placed onto the ocular surface.  No wound leaks were noted.  The patient was taken to the recovery room in stable condition without complications of anesthesia or surgery  Benay Pillow 11/05/2022, 8:28 AM

## 2022-11-05 NOTE — Anesthesia Preprocedure Evaluation (Signed)
Anesthesia Evaluation  Patient identified by MRN, date of birth, ID band Patient awake    Reviewed: Allergy & Precautions, H&P , NPO status , Patient's Chart, lab work & pertinent test results, reviewed documented beta blocker date and time   History of Anesthesia Complications Negative for: history of anesthetic complications  Airway Mallampati: II  TM Distance: >3 FB Neck ROM: full    Dental  (+) Dental Advidsory Given, Upper Dentures, Lower Dentures   Pulmonary neg pulmonary ROS   Pulmonary exam normal breath sounds clear to auscultation       Cardiovascular Exercise Tolerance: Good hypertension, (-) angina +CHF  (-) Past MI and (-) Cardiac Stents Normal cardiovascular exam(-) dysrhythmias + Valvular Problems/Murmurs  Rhythm:regular Rate:Normal     Neuro/Psych negative neurological ROS  negative psych ROS   GI/Hepatic negative GI ROS, Neg liver ROS,,,  Endo/Other  diabetesHypothyroidism    Renal/GU negative Renal ROS  negative genitourinary   Musculoskeletal   Abdominal   Peds  Hematology negative hematology ROS (+)   Anesthesia Other Findings Past Medical History: No date: Allergic rhinitis No date: B12 deficiency No date: Carpal tunnel syndrome No date: Chronic hip pain No date: Chronic insomnia No date: Diabetes mellitus     Comment:  Type II No date: Hyperlipemia No date: Hypertension No date: Idiopathic neuropathy No date: Left thyroid nodule No date: Multinodular goiter No date: Obesity No date: Osteoarthritis No date: Osteoarthritis of right knee No date: Wears dentures     Comment:  full upper and lower   Reproductive/Obstetrics negative OB ROS                             Anesthesia Physical Anesthesia Plan  ASA: 3  Anesthesia Plan: General   Post-op Pain Management:    Induction:   PONV Risk Score and Plan: 3 and Midazolam and Treatment may vary due to  age or medical condition  Airway Management Planned: Natural Airway and Nasal Cannula  Additional Equipment:   Intra-op Plan:   Post-operative Plan:   Informed Consent: I have reviewed the patients History and Physical, chart, labs and discussed the procedure including the risks, benefits and alternatives for the proposed anesthesia with the patient or authorized representative who has indicated his/her understanding and acceptance.     Dental Advisory Given  Plan Discussed with: Anesthesiologist, CRNA and Surgeon  Anesthesia Plan Comments:        Anesthesia Quick Evaluation

## 2022-11-05 NOTE — Transfer of Care (Signed)
Immediate Anesthesia Transfer of Care Note  Patient: Grace [name]  Procedure(s) Performed: CATARACT EXTRACTION PHACO AND INTRAOCULAR LENS PLACEMENT (IOC) RIGHT DIABETIC 8.76 00:48.5 (Right: Eye)  Patient Location: PACU  Anesthesia Type:MAC  Level of Consciousness: awake, alert , and oriented  Airway & Oxygen Therapy: Patient Spontanous Breathing  Post-op Assessment: Report given to RN  Post vital signs: Reviewed and stable  Last Vitals:  Vitals Value Taken Time  BP 90/51 11/05/22 0831  Temp 36.3 C 11/05/22 0831  Pulse 66 11/05/22 0831  Resp 23 11/05/22 0831  SpO2 98 % 11/05/22 0831  Vitals shown include unvalidated device data.  Last Pain:  Vitals:   11/05/22 0709  TempSrc: Temporal  PainSc: 0-No pain         Complications: No notable events documented.

## 2022-11-06 ENCOUNTER — Encounter: Payer: Self-pay | Admitting: Ophthalmology

## 2022-11-06 ENCOUNTER — Other Ambulatory Visit: Payer: Self-pay

## 2022-11-13 DIAGNOSIS — H2512 Age-related nuclear cataract, left eye: Secondary | ICD-10-CM | POA: Diagnosis not present

## 2022-11-15 NOTE — Discharge Instructions (Signed)

## 2022-11-16 NOTE — Anesthesia Preprocedure Evaluation (Signed)
Anesthesia Evaluation  Patient identified by MRN, date of birth, ID band Patient awake    Reviewed: Allergy & Precautions, H&P , NPO status , Patient's Chart, lab work & pertinent test results, reviewed documented beta blocker date and time   History of Anesthesia Complications Negative for: history of anesthetic complications  Airway Mallampati: II  TM Distance: >3 FB Neck ROM: full    Dental  (+) Dental Advidsory Given, Upper Dentures, Lower Dentures   Pulmonary neg pulmonary ROS   Pulmonary exam normal breath sounds clear to auscultation       Cardiovascular Exercise Tolerance: Good hypertension, (-) angina +CHF (HFrEF)  (-) Past MI and (-) Cardiac Stents Normal cardiovascular exam(-) dysrhythmias + Valvular Problems/Murmurs  Rhythm:regular Rate:Normal     Neuro/Psych negative neurological ROS  negative psych ROS   GI/Hepatic negative GI ROS, Neg liver ROS,,,  Endo/Other  diabetes, Type 2, Insulin DependentHypothyroidism    Renal/GU negative Renal ROS  negative genitourinary   Musculoskeletal   Abdominal  (+) + obese  Peds  Hematology negative hematology ROS (+)   Anesthesia Other Findings Past Medical History: No date: Allergic rhinitis No date: B12 deficiency No date: Carpal tunnel syndrome No date: Chronic hip pain No date: Chronic insomnia No date: Diabetes mellitus     Comment:  Type II No date: Hyperlipemia No date: Hypertension No date: Idiopathic neuropathy No date: Left thyroid nodule No date: Multinodular goiter No date: Obesity No date: Osteoarthritis No date: Osteoarthritis of right knee No date: Wears dentures     Comment:  full upper and lower   Reproductive/Obstetrics negative OB ROS                             Anesthesia Physical Anesthesia Plan  ASA: 3  Anesthesia Plan: MAC   Post-op Pain Management:    Induction:   PONV Risk Score and Plan: 3 and  Midazolam and Treatment may vary due to age or medical condition  Airway Management Planned: Natural Airway and Nasal Cannula  Additional Equipment:   Intra-op Plan:   Post-operative Plan:   Informed Consent: I have reviewed the patients History and Physical, chart, labs and discussed the procedure including the risks, benefits and alternatives for the proposed anesthesia with the patient or authorized representative who has indicated his/her understanding and acceptance.       Plan Discussed with: Anesthesiologist, CRNA and Surgeon  Anesthesia Plan Comments:        Anesthesia Quick Evaluation

## 2022-11-19 ENCOUNTER — Encounter
Admission: RE | Disposition: A | Discharge status: Home / Self Care | Payer: Self-pay | Source: Home / Self Care | Attending: Ophthalmology

## 2022-11-19 ENCOUNTER — Ambulatory Visit: Payer: 59 | Admitting: Anesthesiology

## 2022-11-19 ENCOUNTER — Ambulatory Visit
Admission: RE | Admit: 2022-11-19 | Discharge: 2022-11-19 | Disposition: A | Discharge status: Home / Self Care | Payer: 59 | Attending: Ophthalmology | Admitting: Ophthalmology

## 2022-11-19 ENCOUNTER — Encounter: Payer: Self-pay | Admitting: Ophthalmology

## 2022-11-19 DIAGNOSIS — M25559 Pain in unspecified hip: Secondary | ICD-10-CM | POA: Insufficient documentation

## 2022-11-19 DIAGNOSIS — E669 Obesity, unspecified: Secondary | ICD-10-CM | POA: Diagnosis not present

## 2022-11-19 DIAGNOSIS — Z7984 Long term (current) use of oral hypoglycemic drugs: Secondary | ICD-10-CM | POA: Diagnosis not present

## 2022-11-19 DIAGNOSIS — Z6831 Body mass index (BMI) 31.0-31.9, adult: Secondary | ICD-10-CM | POA: Insufficient documentation

## 2022-11-19 DIAGNOSIS — E1136 Type 2 diabetes mellitus with diabetic cataract: Secondary | ICD-10-CM | POA: Insufficient documentation

## 2022-11-19 DIAGNOSIS — F5104 Psychophysiologic insomnia: Secondary | ICD-10-CM | POA: Diagnosis not present

## 2022-11-19 DIAGNOSIS — H2512 Age-related nuclear cataract, left eye: Secondary | ICD-10-CM | POA: Diagnosis not present

## 2022-11-19 DIAGNOSIS — I110 Hypertensive heart disease with heart failure: Secondary | ICD-10-CM | POA: Insufficient documentation

## 2022-11-19 DIAGNOSIS — I5022 Chronic systolic (congestive) heart failure: Secondary | ICD-10-CM | POA: Insufficient documentation

## 2022-11-19 DIAGNOSIS — I509 Heart failure, unspecified: Secondary | ICD-10-CM | POA: Diagnosis not present

## 2022-11-19 DIAGNOSIS — G8929 Other chronic pain: Secondary | ICD-10-CM | POA: Insufficient documentation

## 2022-11-19 DIAGNOSIS — E039 Hypothyroidism, unspecified: Secondary | ICD-10-CM | POA: Insufficient documentation

## 2022-11-19 DIAGNOSIS — E119 Type 2 diabetes mellitus without complications: Secondary | ICD-10-CM

## 2022-11-19 DIAGNOSIS — E785 Hyperlipidemia, unspecified: Secondary | ICD-10-CM | POA: Insufficient documentation

## 2022-11-19 DIAGNOSIS — Z794 Long term (current) use of insulin: Secondary | ICD-10-CM | POA: Insufficient documentation

## 2022-11-19 HISTORY — PX: CATARACT EXTRACTION W/PHACO: SHX586

## 2022-11-19 LAB — GLUCOSE, CAPILLARY: Glucose-Capillary: 117 mg/dL — ABNORMAL HIGH (ref 70–99)

## 2022-11-19 SURGERY — PHACOEMULSIFICATION, CATARACT, WITH IOL INSERTION
Anesthesia: Monitor Anesthesia Care | Site: Eye | Laterality: Left

## 2022-11-19 MED ORDER — MIDAZOLAM HCL 2 MG/2ML IJ SOLN
INTRAMUSCULAR | Status: DC | PRN
  Administered 2022-11-19 (×2): 1 mg via INTRAVENOUS

## 2022-11-19 MED ORDER — SODIUM CHLORIDE 0.9% FLUSH
INTRAVENOUS | Status: DC | PRN
  Administered 2022-11-19: 10 mL via INTRAVENOUS

## 2022-11-19 MED ORDER — ARMC OPHTHALMIC DILATING DROPS
1.0000 | OPHTHALMIC | Status: DC | PRN
  Administered 2022-11-19 (×2): 1 via OPHTHALMIC

## 2022-11-19 MED ORDER — SIGHTPATH DOSE#1 BSS IO SOLN
INTRAOCULAR | Status: DC | PRN
  Administered 2022-11-19: 119 mL via OPHTHALMIC

## 2022-11-19 MED ORDER — SIGHTPATH DOSE#1 NA HYALUR & NA CHOND-NA HYALUR IO KIT
PACK | INTRAOCULAR | Status: DC | PRN
  Administered 2022-11-19: 1 via OPHTHALMIC

## 2022-11-19 MED ORDER — LIDOCAINE HCL (PF) 2 % IJ SOLN
INTRAOCULAR | Status: DC | PRN
  Administered 2022-11-19: 1 mL via INTRAOCULAR

## 2022-11-19 MED ORDER — TETRACAINE HCL 0.5 % OP SOLN
1.0000 [drp] | OPHTHALMIC | Status: DC | PRN
  Administered 2022-11-19 (×2): 1 [drp] via OPHTHALMIC

## 2022-11-19 MED ORDER — SIGHTPATH DOSE#1 BSS IO SOLN
INTRAOCULAR | Status: DC | PRN
  Administered 2022-11-19: 15 mL

## 2022-11-19 MED ORDER — EPHEDRINE SULFATE (PRESSORS) 50 MG/ML IJ SOLN
INTRAMUSCULAR | Status: DC | PRN
  Administered 2022-11-19: 10 mg via INTRAVENOUS

## 2022-11-19 MED ORDER — MOXIFLOXACIN HCL 0.5 % OP SOLN
OPHTHALMIC | Status: DC | PRN
  Administered 2022-11-19: .2 mL via OPHTHALMIC

## 2022-11-19 MED ORDER — FENTANYL CITRATE (PF) 100 MCG/2ML IJ SOLN
INTRAMUSCULAR | Status: DC | PRN
  Administered 2022-11-19: 50 ug via INTRAVENOUS

## 2022-11-19 SURGICAL SUPPLY — 13 items
CATARACT SUITE SIGHTPATH (MISCELLANEOUS) ×1 IMPLANT
DISSECTOR HYDRO NUCLEUS 50X22 (MISCELLANEOUS) ×2 IMPLANT
FEE CATARACT SUITE SIGHTPATH (MISCELLANEOUS) ×2 IMPLANT
GLOVE SURG GAMMEX PI TX LF 7.5 (GLOVE) ×2 IMPLANT
GLOVE SURG SYN 8.5  E (GLOVE) ×1
GLOVE SURG SYN 8.5 E (GLOVE) ×1 IMPLANT
GLOVE SURG SYN 8.5 PF PI (GLOVE) ×2 IMPLANT
LENS IOL TECNIS EYHANCE 23.5 (Intraocular Lens) IMPLANT
NDL FILTER BLUNT 18X1 1/2 (NEEDLE) ×2 IMPLANT
NEEDLE FILTER BLUNT 18X1 1/2 (NEEDLE) ×1 IMPLANT
SYR 3ML LL SCALE MARK (SYRINGE) ×2 IMPLANT
SYR 5ML LL (SYRINGE) ×2 IMPLANT
WATER STERILE IRR 250ML POUR (IV SOLUTION) ×2 IMPLANT

## 2022-11-19 NOTE — Anesthesia Postprocedure Evaluation (Signed)
Anesthesia Post Note  Patient: Grace [name]  Procedure(s) Performed: CATARACT EXTRACTION PHACO AND INTRAOCULAR LENS PLACEMENT (IOC) LEFT DIABETIC  6.86  00:46.9 (Left: Eye)  Patient location during evaluation: PACU Anesthesia Type: MAC Level of consciousness: awake and alert Pain management: pain level controlled Vital Signs Assessment: post-procedure vital signs reviewed and stable Respiratory status: spontaneous breathing, nonlabored ventilation and respiratory function stable Cardiovascular status: blood pressure returned to baseline and stable Postop Assessment: no apparent nausea or vomiting Anesthetic complications: no   No notable events documented.   Last Vitals:  Vitals:   11/19/22 0825 11/19/22 0830  BP: (!) 93/59 (!) 92/48  Pulse: 69 77  Resp: 12 16  Temp: (!) 36.3 C (!) 36.3 C  SpO2: 98% 97%    Last Pain:  Vitals:   11/19/22 0830  TempSrc:   PainSc: 0-No pain                 Iran Ouch

## 2022-11-19 NOTE — Transfer of Care (Signed)
Immediate Anesthesia Transfer of Care Note  Patient: Grace [name]  Procedure(s) Performed: CATARACT EXTRACTION PHACO AND INTRAOCULAR LENS PLACEMENT (IOC) LEFT DIABETIC  6.86  00:46.9 (Left: Eye)  Patient Location: PACU  Anesthesia Type: MAC  Level of Consciousness: awake, alert  and patient cooperative  Airway and Oxygen Therapy: Patient Spontanous Breathing and Patient connected to supplemental oxygen  Post-op Assessment: Post-op Vital signs reviewed, Patient's Cardiovascular Status Stable, Respiratory Function Stable, Patent Airway and No signs of Nausea or vomiting  Post-op Vital Signs: Reviewed and stable  Complications: No notable events documented.

## 2022-11-19 NOTE — H&P (Signed)
[facility]   Primary Care Physician:  Richardean Sale, MD Ophthalmologist: Dr. Benay Pillow  Pre-Procedure History & Physical: HPI:  Grace [name] is a 82 y.o. female here for cataract surgery.   Past Medical History:  Diagnosis Date   Allergic rhinitis    B12 deficiency    Carpal tunnel syndrome    Chronic hip pain    Chronic insomnia    Diabetes mellitus    Type II   Hyperlipemia    Hypertension    Idiopathic neuropathy    Left thyroid nodule    Multinodular goiter    Obesity    Osteoarthritis    Osteoarthritis of right knee    Wears dentures    full upper and lower    Past Surgical History:  Procedure Laterality Date   BARIATRIC SURGERY     10-27-09 per Dr Hinton Dyer Portenier at [place] Right 11/05/2022   Procedure: CATARACT EXTRACTION PHACO AND INTRAOCULAR LENS PLACEMENT ([place]) RIGHT DIABETIC 8.76 00:48.5;  Surgeon: Eulogio Bear, MD;  Location: [place];  Service: Ophthalmology;  Laterality: Right;  Diabetic   CHOLECYSTECTOMY     COLONOSCOPY  2008   per Dr. Sharlett Iles, repeat 5 yrs    COLONOSCOPY     JOINT REPLACEMENT  05   total knee replacement   LEFT HEART CATH AND CORONARY ANGIOGRAPHY N/A 10/27/2018   Procedure: LEFT HEART CATH AND CORONARY ANGIOGRAPHY;  Surgeon: Yolonda Kida, MD;  Location: [place] [facility];  Service: Cardiovascular;  Laterality: N/A;    Prior to Admission medications   Medication Sig Start Date End Date Taking? Authorizing Provider  acetaminophen (TYLENOL) 650 MG CR tablet Take 650 mg by mouth every 8 (eight) hours as needed for pain.   Yes [provider]  Cholecalciferol (VITAMIN D3) 50 MCG (2000 UT) capsule Take 2,000 Units by mouth daily.   Yes [provider]  diclofenac Sodium (VOLTAREN) 1 % GEL Apply topically 4 (four) times daily as needed.   Yes [provider]  furosemide (LASIX) 40 MG tablet Take 1 tablet (40 mg total) by mouth  daily. Patient taking differently: Take 20 mg by mouth daily. 11/14/11  Yes Laurey Morale, MD  insulin glargine (LANTUS) 100 UNIT/ML injection Inject 10 Units into the skin 2 (two) times daily. 11/14/11  Yes Laurey Morale, MD  levothyroxine (SYNTHROID, LEVOTHROID) 25 MCG tablet Take 25 mcg by mouth daily before breakfast.   Yes [provider]  losartan (COZAAR) 25 MG tablet Take 25 mg by mouth daily.   Yes [provider]  Melatonin 10 MG TABS Take by mouth at bedtime as needed.   Yes [provider]  metFORMIN (GLUCOPHAGE) 500 MG tablet TAKE 2 TABLETS BY MOUTH TWICE DAILY Patient taking differently: Take 1,000 mg by mouth 2 (two) times daily with a meal. 11/11/12  Yes Laurey Morale, MD  metoprolol succinate (TOPROL-XL) 25 MG 24 hr tablet Take 25 mg by mouth daily.   Yes [provider]  simvastatin (ZOCOR) 40 MG tablet TAKE ONE TABLET BY MOUTH DAILY Patient taking differently: Take 40 mg by mouth daily. 08/18/12  Yes Laurey Morale, MD    Allergies as of 10/16/2022 - Review Complete 10/27/2018  Allergen Reaction Noted   Wool alcohol  [lanolin] Rash 11/04/2014    Family History  Problem Relation Age of Onset   Arthritis Other    Diabetes Other    Hypertension Other  Heart block Other    Coronary artery disease Other    Breast cancer Sister 52    Social History   Socioeconomic History   Marital status: Widowed    Spouse name: Not on file   Number of children: Not on file   Years of education: Not on file   Highest education level: Not on file  Occupational History   Not on file  Tobacco Use   Smoking status: Never   Smokeless tobacco: Former  Scientific laboratory technician Use: Never used  Substance and Sexual Activity   Alcohol use: Yes    Comment: once every couple of months   Drug use: No   Sexual activity: Not on file  Other Topics Concern   Not on file  Social History Narrative   Not on file   Social Determinants of Health    Financial Resource Strain: Not on file  Food Insecurity: Not on file  Transportation Needs: Not on file  Physical Activity: Not on file  Stress: Not on file  Social Connections: Not on file  Intimate Partner Violence: Not on file    Review of Systems: See HPI, otherwise negative ROS  Physical Exam: BP (!) 113/57   Pulse 61   Temp 98.2 F (36.8 C) (Temporal)   Wt 75.8 kg   SpO2 97%   BMI 31.55 kg/m  General:   Alert, cooperative in NAD Head:  Normocephalic and atraumatic. Respiratory:  Normal work of breathing. Cardiovascular:  RRR  Impression/Plan: Grace [name] is here for cataract surgery.  Risks, benefits, limitations, and alternatives regarding cataract surgery have been reviewed with the patient.  Questions have been answered.  All parties agreeable.   Benay Pillow, MD  11/19/2022, 7:57 AM

## 2022-11-19 NOTE — Addendum Note (Signed)
Addendum  created 11/19/22 1004 by Iran Ouch, MD   Attestation recorded in [place], [place] filed

## 2022-11-19 NOTE — Op Note (Signed)
OPERATIVE NOTE  JAMERRIA DETURK [id number] 11/19/2022   PREOPERATIVE DIAGNOSIS:  Nuclear sclerotic cataract left eye.  H25.12   POSTOPERATIVE DIAGNOSIS:    Nuclear sclerotic cataract left eye.     PROCEDURE:  Phacoemusification with posterior chamber intraocular lens placement of the left eye   LENS:   Implant Name Type Inv. Item Serial No. Manufacturer Lot No. LRB No. Used Action  LENS IOL TECNIS EYHANCE 23.5 - [id number] Intraocular Lens LENS IOL TECNIS EYHANCE 23.5 [id number] [employer]  Left 1 Implanted      Procedure(s) with comments: CATARACT EXTRACTION PHACO AND INTRAOCULAR LENS PLACEMENT (I[facility]) LEFT DIABETIC  6.86  00:46.9 (Left) - Diabetic  DIB00 +23.5   ULTRASOUND TIME: 0 minutes 46 seconds.  CDE 6.86   SURGEON:  Benay Pillow, MD, MPH   ANESTHESIA:  Topical with tetracaine drops augmented with 1% preservative-free intracameral lidocaine.  ESTIMATED BLOOD LOSS: <1 mL   COMPLICATIONS:  None.   DESCRIPTION OF PROCEDURE:  The patient was identified in the holding room and transported to the operating room and placed in the supine position under the operating microscope.  The left eye was identified as the operative eye and it was prepped and draped in the usual sterile ophthalmic fashion.   A 1.0 millimeter clear-corneal paracentesis was made at the 5:00 position. 0.5 ml of preservative-free 1% lidocaine with epinephrine was injected into the anterior chamber.  The anterior chamber was filled with viscoelastic.  A 2.4 millimeter keratome was used to make a near-clear corneal incision at the 2:00 position.  A curvilinear capsulorrhexis was made with a cystotome and capsulorrhexis forceps.  Balanced salt solution was used to hydrodissect and hydrodelineate the nucleus.   Phacoemulsification was then used in stop and chop fashion to remove the lens nucleus and epinucleus.  The remaining cortex was then removed using the irrigation and aspiration handpiece. Viscoelastic was then  placed into the capsular bag to distend it for lens placement.  A lens was then injected into the capsular bag.  The remaining viscoelastic was aspirated.   Wounds were hydrated with balanced salt solution.  The anterior chamber was inflated to a physiologic pressure with balanced salt solution.  Intracameral vigamox 0.1 mL undiltued was injected into the eye and a drop placed onto the ocular surface.  No wound leaks were noted.  The patient was taken to the recovery room in stable condition without complications of anesthesia or surgery  Benay Pillow 11/19/2022, 8:24 AM

## 2022-11-19 NOTE — Anesthesia Procedure Notes (Signed)
Procedure Name: MAC Date/Time: 11/19/2022 8:05 AM  Performed by: Hilbert Odor, CRNAPre-anesthesia Checklist: Patient identified, Emergency Drugs available, Suction available, Patient being monitored and Timeout performed Oxygen Delivery Method: Nasal cannula Induction Type: IV induction

## 2022-11-20 ENCOUNTER — Encounter: Payer: Self-pay | Admitting: Ophthalmology

## 2022-12-03 DIAGNOSIS — I10 Essential (primary) hypertension: Secondary | ICD-10-CM | POA: Diagnosis not present

## 2022-12-03 DIAGNOSIS — Z0130 Encounter for examination of blood pressure without abnormal findings: Secondary | ICD-10-CM | POA: Diagnosis not present

## 2022-12-03 DIAGNOSIS — E11649 Type 2 diabetes mellitus with hypoglycemia without coma: Secondary | ICD-10-CM | POA: Diagnosis not present

## 2022-12-03 DIAGNOSIS — Z1389 Encounter for screening for other disorder: Secondary | ICD-10-CM | POA: Diagnosis not present

## 2022-12-03 DIAGNOSIS — I509 Heart failure, unspecified: Secondary | ICD-10-CM | POA: Diagnosis not present

## 2022-12-12 DIAGNOSIS — E113211 Type 2 diabetes mellitus with mild nonproliferative diabetic retinopathy with macular edema, right eye: Secondary | ICD-10-CM | POA: Diagnosis not present

## 2022-12-31 DIAGNOSIS — M810 Age-related osteoporosis without current pathological fracture: Secondary | ICD-10-CM | POA: Diagnosis not present

## 2022-12-31 DIAGNOSIS — Z0130 Encounter for examination of blood pressure without abnormal findings: Secondary | ICD-10-CM | POA: Diagnosis not present

## 2022-12-31 DIAGNOSIS — I509 Heart failure, unspecified: Secondary | ICD-10-CM | POA: Diagnosis not present

## 2022-12-31 DIAGNOSIS — Z1389 Encounter for screening for other disorder: Secondary | ICD-10-CM | POA: Diagnosis not present

## 2022-12-31 DIAGNOSIS — I10 Essential (primary) hypertension: Secondary | ICD-10-CM | POA: Diagnosis not present

## 2023-01-04 DIAGNOSIS — N182 Chronic kidney disease, stage 2 (mild): Secondary | ICD-10-CM | POA: Diagnosis not present

## 2023-01-04 DIAGNOSIS — I152 Hypertension secondary to endocrine disorders: Secondary | ICD-10-CM | POA: Diagnosis not present

## 2023-01-04 DIAGNOSIS — E1122 Type 2 diabetes mellitus with diabetic chronic kidney disease: Secondary | ICD-10-CM | POA: Diagnosis not present

## 2023-01-04 DIAGNOSIS — M810 Age-related osteoporosis without current pathological fracture: Secondary | ICD-10-CM | POA: Diagnosis not present

## 2023-01-04 DIAGNOSIS — E063 Autoimmune thyroiditis: Secondary | ICD-10-CM | POA: Diagnosis not present

## 2023-01-04 DIAGNOSIS — E1159 Type 2 diabetes mellitus with other circulatory complications: Secondary | ICD-10-CM | POA: Diagnosis not present

## 2023-01-04 DIAGNOSIS — Z79899 Other long term (current) drug therapy: Secondary | ICD-10-CM | POA: Diagnosis not present

## 2023-01-04 DIAGNOSIS — Z794 Long term (current) use of insulin: Secondary | ICD-10-CM | POA: Diagnosis not present

## 2023-01-04 LAB — HEMOGLOBIN A1C: Hemoglobin A1C: 7.7

## 2023-01-04 LAB — HM DIABETES FOOT EXAM: HM Diabetic Foot Exam: NORMAL

## 2023-01-31 ENCOUNTER — Ambulatory Visit (INDEPENDENT_AMBULATORY_CARE_PROVIDER_SITE_OTHER): Payer: 59 | Admitting: Family Medicine

## 2023-01-31 ENCOUNTER — Encounter: Payer: Self-pay | Admitting: Family Medicine

## 2023-01-31 VITALS — BP 120/50 | HR 54 | Ht 61.0 in | Wt 166.0 lb

## 2023-01-31 DIAGNOSIS — E1122 Type 2 diabetes mellitus with diabetic chronic kidney disease: Secondary | ICD-10-CM

## 2023-01-31 DIAGNOSIS — N183 Chronic kidney disease, stage 3 unspecified: Secondary | ICD-10-CM | POA: Diagnosis not present

## 2023-01-31 DIAGNOSIS — I10 Essential (primary) hypertension: Secondary | ICD-10-CM | POA: Diagnosis not present

## 2023-01-31 DIAGNOSIS — E7801 Familial hypercholesterolemia: Secondary | ICD-10-CM | POA: Diagnosis not present

## 2023-01-31 DIAGNOSIS — E039 Hypothyroidism, unspecified: Secondary | ICD-10-CM

## 2023-01-31 DIAGNOSIS — I420 Dilated cardiomyopathy: Secondary | ICD-10-CM | POA: Diagnosis not present

## 2023-01-31 NOTE — Progress Notes (Signed)
Date:  01/31/2023   Name:  Grace [name]   DOB:  [birth year]   MRN:  [id number]   Chief Complaint: No chief complaint on file.  Patient is a 82 year old female who presents for a establish care  exam. The patient reports the following problems: none.[facility] maintenance has been reviewed up to date.      Lab Results  Component Value Date   NA 139 10/28/2018   K 4.1 10/28/2018   CO2 26 10/28/2018   GLUCOSE 150 (H) 10/28/2018   BUN 21 10/28/2018   CREATININE 0.86 10/28/2018   CALCIUM 9.1 10/28/2018   GFRNONAA >60 10/28/2018   Lab Results  Component Value Date   CHOL 133 05/26/2012   HDL 70.20 05/26/2012   LDLCALC 50 05/26/2012   TRIG 64.0 05/26/2012   CHOLHDL 2 05/26/2012   Lab Results  Component Value Date   TSH 1.69 05/26/2012   Lab Results  Component Value Date   HGBA1C 7.7 01/04/2023   Lab Results  Component Value Date   WBC 8.3 10/26/2018   HGB 10.0 (L) 10/26/2018   HCT 33.8 (L) 10/26/2018   MCV 83.7 10/26/2018   PLT 180 10/26/2018   Lab Results  Component Value Date   ALT 17 10/25/2018   AST 27 10/25/2018   ALKPHOS 94 10/25/2018   BILITOT 0.4 10/25/2018   No results found for: "25OHVITD2", "25OHVITD3", "VD25OH"   Review of Systems  Constitutional: Negative.  Negative for chills, fatigue, fever and unexpected weight change.  HENT:  Negative for congestion, ear discharge, ear pain, rhinorrhea, sinus pressure, sneezing and sore throat.   Respiratory:  Negative for cough, shortness of breath, wheezing and stridor.   Cardiovascular:  Negative for chest pain and palpitations.  Gastrointestinal:  Negative for abdominal pain, blood in stool and nausea.  Genitourinary:  Negative for difficulty urinating, dysuria and frequency.  Musculoskeletal:  Positive for arthralgias. Negative for back pain and myalgias.  Skin:  Negative for rash.  Neurological:  Negative for dizziness, weakness and headaches.  Hematological:  Negative for adenopathy. Does not  bruise/bleed easily.  Psychiatric/Behavioral:  Negative for dysphoric mood. The patient is not nervous/anxious.     Patient Active Problem List   Diagnosis Date Noted   Acute on chronic diastolic CHF (congestive heart failure) (HCC) 10/25/2018   Hypothyroidism 10/25/2018   GERD (gastroesophageal reflux disease) 10/25/2018   ECZEMA 07/17/2010   DYSURIA 07/17/2010   PERIPHERAL NEUROPATHY 11/30/2009   MORBID OBESITY 05/28/2008   CANDIDIASIS, VULVOVAGINAL 01/14/2008   OSTEOARTHRITIS 10/20/2007   Diabetes (HCC) 07/28/2007   HLD (hyperlipidemia) 05/15/2007   HTN (hypertension) 05/15/2007    Allergies  Allergen Reactions   Wool Alcohol  [Lanolin] Rash    Past Surgical History:  Procedure Laterality Date   BARIATRIC SURGERY     10-27-09 per Dr Annabelle Harman Portenier at [facility]   CATARACT EXTRACTION [facility] Right 11/05/2022   Procedure: CATARACT EXTRACTION PHACO AND INTRAOCULAR LENS PLACEMENT (IOC) RIGHT DIABETIC 8.76 00:48.5;  Surgeon: Nevada Crane, MD;  Location: [facility];  Service: Ophthalmology;  Laterality: Right;  Diabetic   CATARACT EXTRACTION W/PHACO Left 11/19/2022   Procedure: CATARACT EXTRACTION PHACO AND INTRAOCULAR LENS PLACEMENT (IOC) LEFT DIABETIC  6.86  00:46.9;  Surgeon: Nevada Crane, MD;  Location: [facility];  Service: Ophthalmology;  Laterality: Left;  Diabetic   CHOLECYSTECTOMY     COLONOSCOPY  2008   per Dr. Jarold Motto, repeat 5 yrs    COLONOSCOPY  EYE SURGERY     JOINT REPLACEMENT  2005   total knee replacement   LEFT HEART CATH AND CORONARY ANGIOGRAPHY N/A 10/27/2018   Procedure: LEFT HEART CATH AND CORONARY ANGIOGRAPHY;  Surgeon: Alwyn Pea, MD;  Location: [facility] INVASIVE CV LAB;  Service: Cardiovascular;  Laterality: N/A;    Social History   Tobacco Use   Smoking status: Never   Smokeless tobacco: Former  Building services engineer Use: Never used  Substance Use Topics   Alcohol use: Yes    Comment: once every couple of months    Drug use: No     Medication list has been reviewed and updated.  Current Meds  Medication Sig   acetaminophen (TYLENOL) 650 MG CR tablet Take 650 mg by mouth every 8 (eight) hours as needed for pain.   Cholecalciferol (VITAMIN D3) 50 MCG (2000 UT) capsule Take 2,000 Units by mouth daily.   diclofenac Sodium (VOLTAREN) 1 % GEL Apply topically 4 (four) times daily as needed.   furosemide (LASIX) 40 MG tablet Take 1 tablet (40 mg total) by mouth daily. (Patient taking differently: Take 20 mg by mouth daily.)   levothyroxine (SYNTHROID, LEVOTHROID) 25 MCG tablet Take 25 mcg by mouth daily before breakfast.   losartan (COZAAR) 25 MG tablet Take 12.5 mg by mouth daily.   Melatonin 10 MG TABS Take by mouth at bedtime as needed.   metFORMIN (GLUCOPHAGE) 500 MG tablet TAKE 2 TABLETS BY MOUTH TWICE DAILY (Patient taking differently: Take 1,000 mg by mouth 2 (two) times daily with a meal.)   metoprolol succinate (TOPROL-XL) 25 MG 24 hr tablet Take 25 mg by mouth daily.   simvastatin (ZOCOR) 40 MG tablet TAKE ONE TABLET BY MOUTH DAILY (Patient taking differently: Take 40 mg by mouth daily.)       01/31/2023    1:20 PM  GAD 7 : Generalized Anxiety Score  Nervous, Anxious, on Edge 0  Control/stop worrying 0  Worry too much - different things 0  Trouble relaxing 0  Restless 0  Easily annoyed or irritable 0  Afraid - awful might happen 0  Total GAD 7 Score 0  Anxiety Difficulty Not difficult at all       01/31/2023    1:20 PM  Depression screen PHQ 2/9  Decreased Interest 0  Down, Depressed, Hopeless 0  PHQ - 2 Score 0  Altered sleeping 0  Tired, decreased energy 0  Change in appetite 0  Feeling bad or failure about yourself  0  Trouble concentrating 0  Moving slowly or fidgety/restless 0  Suicidal thoughts 0  PHQ-9 Score 0  Difficult doing work/chores Not difficult at all    BP Readings from Last 3 Encounters:  01/31/23 (!) 120/50  11/19/22 (!) 92/48  11/05/22 (!) 102/58     Physical Exam Vitals and nursing note reviewed. Exam conducted with a chaperone present.  Constitutional:      General: She is not in acute distress.    Appearance: She is not diaphoretic.  HENT:     Head: Normocephalic and atraumatic.     Right Ear: Tympanic membrane and external ear normal.     Left Ear: Tympanic membrane and external ear normal.     Nose: Nose normal.     Mouth/Throat:     Mouth: Mucous membranes are moist.  Eyes:     General:        Right eye: No discharge.        Left  eye: No discharge.     Conjunctiva/sclera: Conjunctivae normal.     Pupils: Pupils are equal, round, and reactive to light.  Neck:     Thyroid: No thyromegaly.     Vascular: No JVD.  Cardiovascular:     Rate and Rhythm: Normal rate and regular rhythm.     Heart sounds: Normal heart sounds. No murmur heard.    No friction rub. No gallop.  Pulmonary:     Effort: Pulmonary effort is normal.     Breath sounds: Normal breath sounds. No wheezing, rhonchi or rales.  Abdominal:     General: Bowel sounds are normal.     Palpations: Abdomen is soft. There is no mass.     Tenderness: There is no abdominal tenderness. There is no guarding.  Musculoskeletal:        General: Normal range of motion.     Cervical back: Normal range of motion and neck supple.  Lymphadenopathy:     Cervical: No cervical adenopathy.  Skin:    General: Skin is warm and dry.  Neurological:     Mental Status: She is alert.     Deep Tendon Reflexes: Reflexes are normal and symmetric.     Wt Readings from Last 3 Encounters:  01/31/23 166 lb (75.3 kg)  11/19/22 167 lb (75.8 kg)  11/05/22 167 lb (75.8 kg)    BP (!) 120/50   Pulse (!) 54   Ht 5\' 1"  (1.549 m)   Wt 166 lb (75.3 kg)   SpO2 97%   BMI 31.37 kg/m   Assessment and Plan: 1. Dilated cardiomyopathy (HCC) Chronic.  Relatively controlled.  Stable.  Followed by Dr. Callwood/cardiology.  Patient is currently taking her losartan as 12.5 mg (one half of the  25 mg losartan) as adjusted by her previous physician at [facility].  I am beginning to think that in February she had low blood pressure readings of the 90/60 range and that on 25 mg this was brought down to 12.5 mg to give her some effect for her congestive heart failure.  Patient may continue at current dosing of 12.5 mg until she sees cardiology at which time it will be determined in which direction of discontinuance continue at current dosing or increasing back to 25 mg mg is in order.  2. Primary hypertension As noted above blood pressure today is 120/50-on patient currently taking a dose of 12.5 mg losartan.  We will continue at this current dosing until rechecked by cardiology next month.  3. Familial hypercholesterolemia Chronic.  Controlled.  Stable.  This is being prescribed by endocrinology as simvastatin 40 mg once a day.  Will be glad to assume care of this medication if endocrinology deems it necessary.  4. Type 2 diabetes mellitus with stage 3 chronic kidney disease, without long-term current use of insulin, unspecified whether stage 3a or 3b CKD (HCC) Chronic.  Controlled.  Stable.  Currently controlled on metformin 500 mg taking 2 tablets twice a day and surveillance of dietary intake of carbohydrates and concentrated sweets.  5. Hypothyroidism, unspecified type Chronic.  Controlled.  Stable.  She is followed by endocrinology as well and is currently on levothyroxine 25 mg once a day.  As best I can tell.    Elizabeth Sauer, MD

## 2023-02-04 DIAGNOSIS — M1990 Unspecified osteoarthritis, unspecified site: Secondary | ICD-10-CM | POA: Diagnosis not present

## 2023-02-04 DIAGNOSIS — E782 Mixed hyperlipidemia: Secondary | ICD-10-CM | POA: Diagnosis not present

## 2023-02-04 DIAGNOSIS — I428 Other cardiomyopathies: Secondary | ICD-10-CM | POA: Diagnosis not present

## 2023-02-04 DIAGNOSIS — I5020 Unspecified systolic (congestive) heart failure: Secondary | ICD-10-CM | POA: Diagnosis not present

## 2023-02-04 DIAGNOSIS — I10 Essential (primary) hypertension: Secondary | ICD-10-CM | POA: Diagnosis not present

## 2023-02-04 DIAGNOSIS — E119 Type 2 diabetes mellitus without complications: Secondary | ICD-10-CM | POA: Diagnosis not present

## 2023-02-07 DIAGNOSIS — H35352 Cystoid macular degeneration, left eye: Secondary | ICD-10-CM | POA: Diagnosis not present

## 2023-02-07 DIAGNOSIS — Z961 Presence of intraocular lens: Secondary | ICD-10-CM | POA: Diagnosis not present

## 2023-03-07 DIAGNOSIS — E113212 Type 2 diabetes mellitus with mild nonproliferative diabetic retinopathy with macular edema, left eye: Secondary | ICD-10-CM | POA: Diagnosis not present

## 2023-03-07 DIAGNOSIS — H43813 Vitreous degeneration, bilateral: Secondary | ICD-10-CM | POA: Diagnosis not present

## 2023-03-07 DIAGNOSIS — H35352 Cystoid macular degeneration, left eye: Secondary | ICD-10-CM | POA: Diagnosis not present

## 2023-03-07 DIAGNOSIS — Z961 Presence of intraocular lens: Secondary | ICD-10-CM | POA: Diagnosis not present

## 2023-03-14 DIAGNOSIS — E1122 Type 2 diabetes mellitus with diabetic chronic kidney disease: Secondary | ICD-10-CM | POA: Diagnosis not present

## 2023-03-19 ENCOUNTER — Other Ambulatory Visit: Payer: Self-pay | Admitting: Family Medicine

## 2023-03-19 DIAGNOSIS — Z1231 Encounter for screening mammogram for malignant neoplasm of breast: Secondary | ICD-10-CM

## 2023-05-08 ENCOUNTER — Ambulatory Visit
Admission: RE | Admit: 2023-05-08 | Discharge: 2023-05-08 | Disposition: A | Discharge status: Home / Self Care | Payer: 59 | Source: Ambulatory Visit | Attending: Family Medicine | Admitting: Family Medicine

## 2023-05-08 DIAGNOSIS — Z1231 Encounter for screening mammogram for malignant neoplasm of breast: Secondary | ICD-10-CM | POA: Diagnosis not present

## 2023-05-21 ENCOUNTER — Telehealth: Payer: Self-pay | Admitting: Family Medicine

## 2023-05-21 NOTE — Telephone Encounter (Signed)
Copied from CRM [id number]. Topic: Complaint - Billing/Coding >> May 21, 2023  2:50 PM Ja-Kwan M wrote: DOS: Patient stated that she can not see the date because she recently had eye surgery Details of complaint: Patient received a bill for $20.82 How would the patient like to see this issue resolved? Patient requests that the billing be filed through her insurance because she should not have to pay for primary care visits.   Route to Research officer, political party.

## 2023-05-22 ENCOUNTER — Telehealth: Payer: Self-pay

## 2023-05-22 NOTE — Telephone Encounter (Signed)
Copied from CRM [id number]. Topic: Complaint - Billing/Coding >> May 21, 2023  2:50 PM Ja-Kwan M wrote: DOS: Patient stated that she can not see the date because she recently had eye surgery Details of complaint: Patient received a bill for $20.82 How would the patient like to see this issue resolved? Patient requests that the billing be filed through her insurance because she should not have to pay for primary care visits.   Route to Research officer, political party.

## 2023-05-22 NOTE — Telephone Encounter (Signed)
I tried to call pt concerning her balance- no answer or vm and asked to "enter a remote access code."

## 2023-05-28 ENCOUNTER — Ambulatory Visit: Payer: 59 | Admitting: Family Medicine

## 2023-06-24 DIAGNOSIS — E042 Nontoxic multinodular goiter: Secondary | ICD-10-CM | POA: Diagnosis not present

## 2023-06-24 DIAGNOSIS — E1122 Type 2 diabetes mellitus with diabetic chronic kidney disease: Secondary | ICD-10-CM | POA: Diagnosis not present

## 2023-06-24 DIAGNOSIS — Z794 Long term (current) use of insulin: Secondary | ICD-10-CM | POA: Diagnosis not present

## 2023-06-24 DIAGNOSIS — I152 Hypertension secondary to endocrine disorders: Secondary | ICD-10-CM | POA: Diagnosis not present

## 2023-06-24 DIAGNOSIS — E1159 Type 2 diabetes mellitus with other circulatory complications: Secondary | ICD-10-CM | POA: Diagnosis not present

## 2023-06-24 DIAGNOSIS — M810 Age-related osteoporosis without current pathological fracture: Secondary | ICD-10-CM | POA: Diagnosis not present

## 2023-06-24 DIAGNOSIS — E063 Autoimmune thyroiditis: Secondary | ICD-10-CM | POA: Diagnosis not present

## 2023-06-24 DIAGNOSIS — N182 Chronic kidney disease, stage 2 (mild): Secondary | ICD-10-CM | POA: Diagnosis not present

## 2023-06-25 DIAGNOSIS — I509 Heart failure, unspecified: Secondary | ICD-10-CM | POA: Diagnosis not present

## 2023-06-25 DIAGNOSIS — I10 Essential (primary) hypertension: Secondary | ICD-10-CM | POA: Diagnosis not present

## 2023-06-25 DIAGNOSIS — Z1389 Encounter for screening for other disorder: Secondary | ICD-10-CM | POA: Diagnosis not present

## 2023-06-25 DIAGNOSIS — Z23 Encounter for immunization: Secondary | ICD-10-CM | POA: Diagnosis not present

## 2023-06-25 DIAGNOSIS — Z0130 Encounter for examination of blood pressure without abnormal findings: Secondary | ICD-10-CM | POA: Diagnosis not present

## 2023-06-25 DIAGNOSIS — E11649 Type 2 diabetes mellitus with hypoglycemia without coma: Secondary | ICD-10-CM | POA: Diagnosis not present

## 2023-06-25 DIAGNOSIS — E785 Hyperlipidemia, unspecified: Secondary | ICD-10-CM | POA: Diagnosis not present

## 2023-06-27 DIAGNOSIS — Z961 Presence of intraocular lens: Secondary | ICD-10-CM | POA: Diagnosis not present

## 2023-06-27 DIAGNOSIS — E113213 Type 2 diabetes mellitus with mild nonproliferative diabetic retinopathy with macular edema, bilateral: Secondary | ICD-10-CM | POA: Diagnosis not present

## 2023-06-27 DIAGNOSIS — H43813 Vitreous degeneration, bilateral: Secondary | ICD-10-CM | POA: Diagnosis not present

## 2023-07-12 DIAGNOSIS — E1122 Type 2 diabetes mellitus with diabetic chronic kidney disease: Secondary | ICD-10-CM | POA: Diagnosis not present

## 2023-07-24 DIAGNOSIS — Z23 Encounter for immunization: Secondary | ICD-10-CM | POA: Diagnosis not present

## 2023-08-16 DIAGNOSIS — E1142 Type 2 diabetes mellitus with diabetic polyneuropathy: Secondary | ICD-10-CM | POA: Diagnosis not present

## 2023-08-16 DIAGNOSIS — M1990 Unspecified osteoarthritis, unspecified site: Secondary | ICD-10-CM | POA: Diagnosis not present

## 2023-08-16 DIAGNOSIS — E119 Type 2 diabetes mellitus without complications: Secondary | ICD-10-CM | POA: Diagnosis not present

## 2023-08-16 DIAGNOSIS — I420 Dilated cardiomyopathy: Secondary | ICD-10-CM | POA: Diagnosis not present

## 2023-08-16 DIAGNOSIS — I428 Other cardiomyopathies: Secondary | ICD-10-CM | POA: Diagnosis not present

## 2023-08-16 DIAGNOSIS — I10 Essential (primary) hypertension: Secondary | ICD-10-CM | POA: Diagnosis not present

## 2023-08-16 DIAGNOSIS — I5020 Unspecified systolic (congestive) heart failure: Secondary | ICD-10-CM | POA: Diagnosis not present

## 2023-08-16 DIAGNOSIS — E782 Mixed hyperlipidemia: Secondary | ICD-10-CM | POA: Diagnosis not present

## 2023-08-16 DIAGNOSIS — I5042 Chronic combined systolic (congestive) and diastolic (congestive) heart failure: Secondary | ICD-10-CM | POA: Diagnosis not present

## 2023-08-16 DIAGNOSIS — E1159 Type 2 diabetes mellitus with other circulatory complications: Secondary | ICD-10-CM | POA: Diagnosis not present

## 2023-08-16 DIAGNOSIS — G4733 Obstructive sleep apnea (adult) (pediatric): Secondary | ICD-10-CM | POA: Diagnosis not present

## 2023-09-19 DIAGNOSIS — E113213 Type 2 diabetes mellitus with mild nonproliferative diabetic retinopathy with macular edema, bilateral: Secondary | ICD-10-CM | POA: Diagnosis not present

## 2023-09-19 DIAGNOSIS — H43813 Vitreous degeneration, bilateral: Secondary | ICD-10-CM | POA: Diagnosis not present

## 2023-09-19 DIAGNOSIS — Z961 Presence of intraocular lens: Secondary | ICD-10-CM | POA: Diagnosis not present

## 2023-09-30 DIAGNOSIS — Z1389 Encounter for screening for other disorder: Secondary | ICD-10-CM | POA: Diagnosis not present

## 2023-09-30 DIAGNOSIS — I509 Heart failure, unspecified: Secondary | ICD-10-CM | POA: Diagnosis not present

## 2023-09-30 DIAGNOSIS — Z0130 Encounter for examination of blood pressure without abnormal findings: Secondary | ICD-10-CM | POA: Diagnosis not present

## 2023-09-30 DIAGNOSIS — N1832 Chronic kidney disease, stage 3b: Secondary | ICD-10-CM | POA: Diagnosis not present

## 2023-09-30 DIAGNOSIS — E785 Hyperlipidemia, unspecified: Secondary | ICD-10-CM | POA: Diagnosis not present

## 2023-09-30 DIAGNOSIS — E11649 Type 2 diabetes mellitus with hypoglycemia without coma: Secondary | ICD-10-CM | POA: Diagnosis not present

## 2023-09-30 DIAGNOSIS — I10 Essential (primary) hypertension: Secondary | ICD-10-CM | POA: Diagnosis not present

## 2023-10-11 DIAGNOSIS — E1122 Type 2 diabetes mellitus with diabetic chronic kidney disease: Secondary | ICD-10-CM | POA: Diagnosis not present

## 2023-10-21 DIAGNOSIS — E042 Nontoxic multinodular goiter: Secondary | ICD-10-CM | POA: Diagnosis not present

## 2023-10-21 DIAGNOSIS — M810 Age-related osteoporosis without current pathological fracture: Secondary | ICD-10-CM | POA: Diagnosis not present

## 2023-10-28 DIAGNOSIS — I152 Hypertension secondary to endocrine disorders: Secondary | ICD-10-CM | POA: Diagnosis not present

## 2023-10-28 DIAGNOSIS — E538 Deficiency of other specified B group vitamins: Secondary | ICD-10-CM | POA: Diagnosis not present

## 2023-10-28 DIAGNOSIS — E1159 Type 2 diabetes mellitus with other circulatory complications: Secondary | ICD-10-CM | POA: Diagnosis not present

## 2023-10-28 DIAGNOSIS — E1169 Type 2 diabetes mellitus with other specified complication: Secondary | ICD-10-CM | POA: Diagnosis not present

## 2023-10-28 DIAGNOSIS — E042 Nontoxic multinodular goiter: Secondary | ICD-10-CM | POA: Diagnosis not present

## 2023-10-28 DIAGNOSIS — N182 Chronic kidney disease, stage 2 (mild): Secondary | ICD-10-CM | POA: Diagnosis not present

## 2023-10-28 DIAGNOSIS — E1122 Type 2 diabetes mellitus with diabetic chronic kidney disease: Secondary | ICD-10-CM | POA: Diagnosis not present

## 2023-10-28 DIAGNOSIS — Z794 Long term (current) use of insulin: Secondary | ICD-10-CM | POA: Diagnosis not present

## 2023-10-28 DIAGNOSIS — M810 Age-related osteoporosis without current pathological fracture: Secondary | ICD-10-CM | POA: Diagnosis not present

## 2023-10-28 DIAGNOSIS — E785 Hyperlipidemia, unspecified: Secondary | ICD-10-CM | POA: Diagnosis not present

## 2023-10-28 DIAGNOSIS — E063 Autoimmune thyroiditis: Secondary | ICD-10-CM | POA: Diagnosis not present

## 2023-10-31 DIAGNOSIS — E1122 Type 2 diabetes mellitus with diabetic chronic kidney disease: Secondary | ICD-10-CM | POA: Diagnosis not present

## 2023-10-31 DIAGNOSIS — E1159 Type 2 diabetes mellitus with other circulatory complications: Secondary | ICD-10-CM | POA: Diagnosis not present

## 2023-10-31 DIAGNOSIS — E1169 Type 2 diabetes mellitus with other specified complication: Secondary | ICD-10-CM | POA: Diagnosis not present

## 2023-10-31 DIAGNOSIS — I152 Hypertension secondary to endocrine disorders: Secondary | ICD-10-CM | POA: Diagnosis not present

## 2023-10-31 DIAGNOSIS — N182 Chronic kidney disease, stage 2 (mild): Secondary | ICD-10-CM | POA: Diagnosis not present

## 2023-10-31 DIAGNOSIS — Z794 Long term (current) use of insulin: Secondary | ICD-10-CM | POA: Diagnosis not present

## 2023-10-31 DIAGNOSIS — E063 Autoimmune thyroiditis: Secondary | ICD-10-CM | POA: Diagnosis not present

## 2023-10-31 DIAGNOSIS — E538 Deficiency of other specified B group vitamins: Secondary | ICD-10-CM | POA: Diagnosis not present

## 2023-10-31 DIAGNOSIS — E785 Hyperlipidemia, unspecified: Secondary | ICD-10-CM | POA: Diagnosis not present

## 2023-10-31 DIAGNOSIS — M810 Age-related osteoporosis without current pathological fracture: Secondary | ICD-10-CM | POA: Diagnosis not present

## 2023-11-13 DIAGNOSIS — Z742 Need for assistance at home and no other household member able to render care: Secondary | ICD-10-CM | POA: Diagnosis not present

## 2023-11-13 DIAGNOSIS — E1122 Type 2 diabetes mellitus with diabetic chronic kidney disease: Secondary | ICD-10-CM | POA: Diagnosis not present

## 2023-11-13 DIAGNOSIS — E1159 Type 2 diabetes mellitus with other circulatory complications: Secondary | ICD-10-CM | POA: Diagnosis not present

## 2023-11-13 DIAGNOSIS — I5042 Chronic combined systolic (congestive) and diastolic (congestive) heart failure: Secondary | ICD-10-CM | POA: Diagnosis not present

## 2023-11-13 DIAGNOSIS — I152 Hypertension secondary to endocrine disorders: Secondary | ICD-10-CM | POA: Diagnosis not present

## 2023-11-13 DIAGNOSIS — E782 Mixed hyperlipidemia: Secondary | ICD-10-CM | POA: Diagnosis not present

## 2023-11-13 DIAGNOSIS — E1169 Type 2 diabetes mellitus with other specified complication: Secondary | ICD-10-CM | POA: Diagnosis not present

## 2023-11-13 DIAGNOSIS — Z0000 Encounter for general adult medical examination without abnormal findings: Secondary | ICD-10-CM | POA: Diagnosis not present

## 2023-11-13 DIAGNOSIS — N182 Chronic kidney disease, stage 2 (mild): Secondary | ICD-10-CM | POA: Diagnosis not present

## 2023-11-13 DIAGNOSIS — E1142 Type 2 diabetes mellitus with diabetic polyneuropathy: Secondary | ICD-10-CM | POA: Diagnosis not present

## 2023-11-27 ENCOUNTER — Ambulatory Visit (INDEPENDENT_AMBULATORY_CARE_PROVIDER_SITE_OTHER): Payer: 59

## 2023-11-27 DIAGNOSIS — Z0000 Encounter for general adult medical examination without abnormal findings: Secondary | ICD-10-CM

## 2023-11-27 NOTE — Progress Notes (Signed)
 Subjective:   Grace [name] is a 83 y.o. who presents for a Medicare Wellness preventive visit.  Visit Complete: Virtual I connected with  Deyani M Mehlman on 11/27/23 by a audio enabled telemedicine application and verified that I am speaking with the correct person using two identifiers.  Patient Location: Home  Provider Location: Office/Clinic  I discussed the limitations of evaluation and management by telemedicine. The patient expressed understanding and agreed to proceed.  Vital Signs: Because this visit was a virtual/telehealth visit, some criteria may be missing or patient reported. Any vitals not documented were not able to be obtained and vitals that have been documented are patient reported.  VideoDeclined- This patient declined Librarian, academic. Therefore the visit was completed with audio only.  AWV Questionnaire: No: Patient Medicare AWV questionnaire was not completed prior to this visit.  Cardiac Risk Factors include: advanced age (>76men, >14 women);diabetes mellitus;dyslipidemia;hypertension;obesity (BMI >30kg/m2)     Objective:    There were no vitals filed for this visit. There is no height or weight on file to calculate BMI.     11/27/2023    2:48 PM 11/19/2022    7:01 AM 11/05/2022    7:06 AM 10/27/2018    1:04 PM 10/25/2018   11:05 PM 10/25/2018    5:22 PM 09/01/2016    7:02 PM  Advanced Directives  Does Patient Have a Medical Advance Directive? No No No No No No No  Would patient like information on creating a medical advance directive? No - Patient declined  No - Patient declined No - Patient declined No - Patient declined No - Patient declined No - Patient declined    Current Medications (verified) Outpatient Encounter Medications as of 11/27/2023  Medication Sig   acetaminophen (TYLENOL) 650 MG CR tablet Take 650 mg by mouth every 8 (eight) hours as needed for pain.   Cholecalciferol (VITAMIN D3) 50 MCG (2000 UT)  capsule Take 2,000 Units by mouth daily.   diclofenac Sodium (VOLTAREN) 1 % GEL Apply topically 4 (four) times daily as needed.   furosemide (LASIX) 40 MG tablet Take 1 tablet (40 mg total) by mouth daily. (Patient taking differently: Take 20 mg by mouth daily.)   levothyroxine (SYNTHROID, LEVOTHROID) 25 MCG tablet Take 25 mcg by mouth daily before breakfast.   losartan (COZAAR) 25 MG tablet Take 12.5 mg by mouth daily.   Melatonin 10 MG TABS Take by mouth at bedtime as needed.   metFORMIN (GLUCOPHAGE) 500 MG tablet TAKE 2 TABLETS BY MOUTH TWICE DAILY (Patient taking differently: Take 1,000 mg by mouth 2 (two) times daily with a meal.)   metoprolol succinate (TOPROL-XL) 25 MG 24 hr tablet Take 25 mg by mouth daily.   simvastatin (ZOCOR) 40 MG tablet TAKE ONE TABLET BY MOUTH DAILY (Patient taking differently: Take 40 mg by mouth daily.)   No facility-administered encounter medications on file as of 11/27/2023.    Allergies (verified) Wool alcohol  [lanolin]   History: Past Medical History:  Diagnosis Date   Allergic rhinitis    B12 deficiency    Carpal tunnel syndrome    Chronic hip pain    Chronic insomnia    Diabetes mellitus    Type II   Hyperlipemia    Hypertension    Idiopathic neuropathy    Left thyroid nodule    Multinodular goiter    Obesity    Osteoarthritis    Osteoarthritis of right knee    Wears dentures  full upper and lower   Past Surgical History:  Procedure Laterality Date   BARIATRIC SURGERY     10-27-09 per Dr Annabelle Harman Portenier at [facility]   CATARACT EXTRACTION [facility] Right 11/05/2022   Procedure: CATARACT EXTRACTION PHACO AND INTRAOCULAR LENS PLACEMENT (IOC) RIGHT DIABETIC 8.76 00:48.5;  Surgeon: Nevada Crane, MD;  Location: [facility];  Service: Ophthalmology;  Laterality: Right;  Diabetic   CATARACT EXTRACTION W/PHACO Left 11/19/2022   Procedure: CATARACT EXTRACTION PHACO AND INTRAOCULAR LENS PLACEMENT (IOC) LEFT DIABETIC  6.86  00:46.9;   Surgeon: Nevada Crane, MD;  Location: [facility];  Service: Ophthalmology;  Laterality: Left;  Diabetic   CHOLECYSTECTOMY     COLONOSCOPY  2008   per Dr. Jarold Motto, repeat 5 yrs    COLONOSCOPY     EYE SURGERY     JOINT REPLACEMENT  2005   total knee replacement   LEFT HEART CATH AND CORONARY ANGIOGRAPHY N/A 10/27/2018   Procedure: LEFT HEART CATH AND CORONARY ANGIOGRAPHY;  Surgeon: Alwyn Pea, MD;  Location: [facility] INVASIVE CV LAB;  Service: Cardiovascular;  Laterality: N/A;   Family History  Problem Relation Age of Onset   Arthritis Other    Diabetes Other    Hypertension Other    Heart block Other    Coronary artery disease Other    Breast cancer Sister 67   Social History   Socioeconomic History   Marital status: Widowed    Spouse name: Not on file   Number of children: Not on file   Years of education: Not on file   Highest education level: Not on file  Occupational History   Not on file  Tobacco Use   Smoking status: Never   Smokeless tobacco: Former  Advertising account planner   Vaping status: Never Used  Substance and Sexual Activity   Alcohol use: Yes    Comment: once every couple of months   Drug use: No   Sexual activity: Not on file  Other Topics Concern   Not on file  Social History Narrative   Not on file   Social Drivers of Health   Financial Resource Strain: Low Risk  (11/27/2023)   Overall Financial Resource Strain (CARDIA)    Difficulty of Paying Living Expenses: Not very hard  Recent Concern: Financial Resource Strain - Medium Risk (11/13/2023)   Received from [facility]   Overall Financial Resource Strain (CARDIA)    Difficulty of Paying Living Expenses: Somewhat hard  Food Insecurity: Food Insecurity Present (11/27/2023)   Hunger Vital Sign    Worried About Running Out of Food in the Last Year: Sometimes true    Ran Out of Food in the Last Year: Sometimes true  Transportation Needs: No Transportation Needs (11/27/2023)    PRAPARE - Administrator, Civil Service (Medical): No    Lack of Transportation (Non-Medical): No  Recent Concern: Transportation Needs - Unmet Transportation Needs (11/13/2023)   Received from [facility] - Transportation    In the past 12 months, has lack of transportation kept you from medical appointments or from getting medications?: Yes    Lack of Transportation (Non-Medical): No  Physical Activity: Insufficiently Active (11/27/2023)   Exercise Vital Sign    Days of Exercise per Week: 3 days    Minutes of Exercise per Session: 30 min  Stress: No Stress Concern Present (11/27/2023)   [employer] of Occupational Health - Occupational Stress Questionnaire  Feeling of Stress : Not at all  Social Connections: Moderately Isolated (11/27/2023)   Social Connection and Isolation Panel [NHANES]    Frequency of Communication with Friends and Family: More than three times a week    Frequency of Social Gatherings with Friends and Family: Once a week    Attends Religious Services: More than 4 times per year    Active Member of [employer] or Organizations: No    Attends Banker Meetings: Never    Marital Status: Widowed    Tobacco Counseling Counseling given: Not Answered    Clinical Intake:  Pre-visit preparation completed: Yes  Pain : No/denies pain     BMI - recorded: 31.4 Nutritional Status: BMI > 30  Obese Nutritional Risks: None Diabetes: Yes CBG done?: No Did pt. bring in CBG monitor from home?: No  How often do you need to have someone help you when you read instructions, pamphlets, or other written materials from your doctor or pharmacy?: 1 - Never  Interpreter Needed?: No  Information entered by :: Kennedy Bucker, LPN   Activities of Daily Living     11/27/2023    2:49 PM  In your present state of health, do you have any difficulty performing the following activities:  Hearing? 0  Vision? 0  Difficulty  concentrating or making decisions? 0  Walking or climbing stairs? 1  Comment KNEE  Dressing or bathing? 0  Doing errands, shopping? 0  Preparing Food and eating ? N  Using the Toilet? N  In the past six months, have you accidently leaked urine? N  Do you have problems with loss of bowel control? N  Managing your Medications? N  Managing your Finances? N  Housekeeping or managing your Housekeeping? N    Patient Care Team: Duanne Limerick, MD as PCP - General (Family Medicine) Mardella Layman, MD (Gastroenterology) Portenier, Annabelle Harman, MD as Referring Physician (Surgery) Nevada Crane, MD as Consulting Physician (Ophthalmology)  Indicate any recent Medical Services you may have received from other than [facility] providers in the past year (date may be approximate).     Assessment:   This is a routine wellness examination for Grace [name].  Hearing/Vision screen Hearing Screening - Comments:: NO AIDS Vision Screening - Comments:: WEARS GLASSES MOST OF THE TIME- DR.KING   Goals Addressed             This Visit's Progress    DIET - EAT MORE FRUITS AND VEGETABLES         Depression Screen     11/27/2023    2:45 PM 01/31/2023    1:20 PM  PHQ 2/9 Scores  PHQ - 2 Score 0 0  PHQ- 9 Score 0 0    Fall Risk     11/27/2023    2:49 PM 01/31/2023    1:19 PM  Fall Risk   Falls in the past year? 0 0  Number falls in past yr: 0 0  Injury with Fall? 0 0  Risk for fall due to : No Fall Risks No Fall Risks  Follow up Falls prevention discussed;Falls evaluation completed Falls evaluation completed    MEDICARE RISK AT HOME:  Medicare Risk at Home Any stairs in or around the home?: Yes If so, are there any without handrails?: Yes Home free of loose throw rugs in walkways, pet beds, electrical cords, etc?: Yes Adequate lighting in your home to reduce risk of falls?: Yes Life alert?: No Use of a cane, walker or  w/c?: No Grab bars in the bathroom?: Yes Shower chair or bench in shower?:  No Elevated toilet seat or a handicapped toilet?: Yes  TIMED UP AND GO:  Was the test performed?  No  Cognitive Function: 6CIT completed        11/27/2023    2:50 PM  6CIT Screen  What Year? 0 points  What month? 0 points  What time? 0 points  Count back from 20 0 points  Months in reverse 0 points  Repeat phrase 0 points  Total Score 0 points    Immunizations Immunization History  Administered Date(s) Administered   Influenza Split 07/05/2011   Influenza Whole 06/29/2008, 07/17/2010   Influenza-Unspecified 10/16/2023   Moderna Covid-19 Fall Seasonal Vaccine 46yrs & older 08/01/2022   Moderna Covid-19 Vaccine Bivalent Booster 12yrs & up 10/19/2021   PFIZER(Purple Top)SARS-COV-2 Vaccination 04/13/2021    Screening Tests Health Maintenance  Topic Date Due   OPHTHALMOLOGY EXAM  Never done   DTaP/Tdap/Td (1 - Tdap) Never done   Diabetic kidney evaluation - Urine ACR  12/01/2010   Diabetic kidney evaluation - eGFR measurement  10/29/2019   COVID-19 Vaccine (7 - 2024-25 season) 06/02/2023   HEMOGLOBIN A1C  07/06/2023   Pneumonia Vaccine 65+ Years old (1 of 2 - PCV) 01/31/2024 (Originally 01/29/1947)   FOOT EXAM  01/04/2024   Medicare Annual Wellness (AWV)  11/26/2024   INFLUENZA VACCINE  Completed   DEXA SCAN  Completed   Zoster Vaccines- Shingrix  Completed   HPV VACCINES  Aged Out   Colonoscopy  Discontinued    Health Maintenance  Health Maintenance Due  Topic Date Due   OPHTHALMOLOGY EXAM  Never done   DTaP/Tdap/Td (1 - Tdap) Never done   Diabetic kidney evaluation - Urine ACR  12/01/2010   Diabetic kidney evaluation - eGFR measurement  10/29/2019   COVID-19 Vaccine (7 - 2024-25 season) 06/02/2023   HEMOGLOBIN A1C  07/06/2023   Health Maintenance Items Addressed:   Additional Screening:  Vision Screening: Recommended annual ophthalmology exams for early detection of glaucoma and other disorders of the eye.  Dental Screening: Recommended annual dental  exams for proper oral hygiene  Community Resource Referral / Chronic Care Management: CRR required this visit?  No   CCM required this visit?  No     Plan:     I have personally reviewed and noted the following in the patient's chart:   Medical and social history Use of alcohol, tobacco or illicit drugs  Current medications and supplements including opioid prescriptions. Patient is not currently taking opioid prescriptions. Functional ability and status Nutritional status Physical activity Advanced directives List of other physicians Hospitalizations, surgeries, and ER visits in previous 12 months Vitals Screenings to include cognitive, depression, and falls Referrals and appointments  In addition, I have reviewed and discussed with patient certain preventive protocols, quality metrics, and best practice recommendations. A written personalized care plan for preventive services as well as general preventive health recommendations were provided to patient.     Hal Hope, LPN   6/96/2952   After Visit Summary: (MyChart) Due to this being a telephonic visit, the after visit summary with patients personalized plan was offered to patient via MyChart   Notes: Nothing significant to report at this time.

## 2023-11-27 NOTE — Patient Instructions (Addendum)
 Ms. Grace [name] , Thank you for taking time to come for your Medicare Wellness Visit. I appreciate your ongoing commitment to your health goals. Please review the following plan we discussed and let me know if I can assist you in the future.   Referrals/Orders/Follow-Ups/Clinician Recommendations: NONE  This is a list of the screening recommended for you and due dates:  Health Maintenance  Topic Date Due   Eye exam for diabetics  Never done   DTaP/Tdap/Td vaccine (1 - Tdap) Never done   Yearly kidney health urinalysis for diabetes  12/01/2010   Yearly kidney function blood test for diabetes  10/29/2019   COVID-19 Vaccine (7 - 2024-25 season) 06/02/2023   Hemoglobin A1C  07/06/2023   Pneumonia Vaccine (1 of 2 - PCV) 01/31/2024*   Complete foot exam   01/04/2024   Medicare Annual Wellness Visit  11/26/2024   Flu Shot  Completed   DEXA scan (bone density measurement)  Completed   Zoster (Shingles) Vaccine  Completed   HPV Vaccine  Aged Out   Colon Cancer Screening  Discontinued  *Topic was postponed. The date shown is not the original due date.    Advanced directives: (ACP Link)Information on Advanced Care Planning can be found at [facility] Directives Advance Health Care Directives ([email or web address]   Next Medicare Annual Wellness Visit scheduled for next year: Yes   12/02/24 @ 1:20 PM BY PHONE

## 2023-11-28 DIAGNOSIS — K5901 Slow transit constipation: Secondary | ICD-10-CM | POA: Diagnosis not present

## 2023-12-05 DIAGNOSIS — H43813 Vitreous degeneration, bilateral: Secondary | ICD-10-CM | POA: Diagnosis not present

## 2023-12-05 DIAGNOSIS — Z961 Presence of intraocular lens: Secondary | ICD-10-CM | POA: Diagnosis not present

## 2023-12-05 DIAGNOSIS — E113213 Type 2 diabetes mellitus with mild nonproliferative diabetic retinopathy with macular edema, bilateral: Secondary | ICD-10-CM | POA: Diagnosis not present

## 2023-12-27 DIAGNOSIS — I152 Hypertension secondary to endocrine disorders: Secondary | ICD-10-CM | POA: Diagnosis not present

## 2023-12-27 DIAGNOSIS — I420 Dilated cardiomyopathy: Secondary | ICD-10-CM | POA: Diagnosis not present

## 2023-12-27 DIAGNOSIS — G4733 Obstructive sleep apnea (adult) (pediatric): Secondary | ICD-10-CM | POA: Diagnosis not present

## 2023-12-27 DIAGNOSIS — I5020 Unspecified systolic (congestive) heart failure: Secondary | ICD-10-CM | POA: Diagnosis not present

## 2023-12-27 DIAGNOSIS — E1142 Type 2 diabetes mellitus with diabetic polyneuropathy: Secondary | ICD-10-CM | POA: Diagnosis not present

## 2023-12-27 DIAGNOSIS — E1159 Type 2 diabetes mellitus with other circulatory complications: Secondary | ICD-10-CM | POA: Diagnosis not present

## 2023-12-27 DIAGNOSIS — E119 Type 2 diabetes mellitus without complications: Secondary | ICD-10-CM | POA: Diagnosis not present

## 2023-12-27 DIAGNOSIS — I428 Other cardiomyopathies: Secondary | ICD-10-CM | POA: Diagnosis not present

## 2023-12-27 DIAGNOSIS — I5042 Chronic combined systolic (congestive) and diastolic (congestive) heart failure: Secondary | ICD-10-CM | POA: Diagnosis not present

## 2023-12-27 DIAGNOSIS — I10 Essential (primary) hypertension: Secondary | ICD-10-CM | POA: Diagnosis not present

## 2023-12-27 DIAGNOSIS — E782 Mixed hyperlipidemia: Secondary | ICD-10-CM | POA: Diagnosis not present

## 2024-01-01 DIAGNOSIS — M810 Age-related osteoporosis without current pathological fracture: Secondary | ICD-10-CM | POA: Diagnosis not present

## 2024-01-10 DIAGNOSIS — E1122 Type 2 diabetes mellitus with diabetic chronic kidney disease: Secondary | ICD-10-CM | POA: Diagnosis not present

## 2024-03-06 DIAGNOSIS — E1122 Type 2 diabetes mellitus with diabetic chronic kidney disease: Secondary | ICD-10-CM | POA: Diagnosis not present

## 2024-03-06 DIAGNOSIS — I152 Hypertension secondary to endocrine disorders: Secondary | ICD-10-CM | POA: Diagnosis not present

## 2024-03-06 DIAGNOSIS — Z794 Long term (current) use of insulin: Secondary | ICD-10-CM | POA: Diagnosis not present

## 2024-03-06 DIAGNOSIS — E063 Autoimmune thyroiditis: Secondary | ICD-10-CM | POA: Diagnosis not present

## 2024-03-06 DIAGNOSIS — E1159 Type 2 diabetes mellitus with other circulatory complications: Secondary | ICD-10-CM | POA: Diagnosis not present

## 2024-03-06 DIAGNOSIS — N182 Chronic kidney disease, stage 2 (mild): Secondary | ICD-10-CM | POA: Diagnosis not present

## 2024-03-06 DIAGNOSIS — M810 Age-related osteoporosis without current pathological fracture: Secondary | ICD-10-CM | POA: Diagnosis not present

## 2024-03-30 ENCOUNTER — Other Ambulatory Visit: Payer: Self-pay | Admitting: Internal Medicine

## 2024-03-30 DIAGNOSIS — Z1231 Encounter for screening mammogram for malignant neoplasm of breast: Secondary | ICD-10-CM

## 2024-05-08 ENCOUNTER — Ambulatory Visit
Admission: RE | Admit: 2024-05-08 | Discharge: 2024-05-08 | Disposition: A | Discharge status: Home / Self Care | Source: Ambulatory Visit | Attending: Internal Medicine | Admitting: Internal Medicine

## 2024-05-08 DIAGNOSIS — Z1231 Encounter for screening mammogram for malignant neoplasm of breast: Secondary | ICD-10-CM | POA: Insufficient documentation

## 2024-12-02 ENCOUNTER — Ambulatory Visit: Payer: 59
